# Patient Record
Sex: Male | Born: 1950 | Race: White | Hispanic: No | Marital: Married | Smoking: Never smoker
Health system: Southern US, Community
[De-identification: ages and names within clinical notes are randomized; demographics above are authoritative.]

## PROBLEM LIST (undated history)

## (undated) DIAGNOSIS — Z860100 Personal history of colon polyps, unspecified: Secondary | ICD-10-CM

## (undated) DIAGNOSIS — I10 Essential (primary) hypertension: Secondary | ICD-10-CM

## (undated) HISTORY — PX: CARPAL TUNNEL RELEASE: SHX101

## (undated) HISTORY — DX: Personal history of colonic polyps: Z86.010

## (undated) HISTORY — DX: Essential (primary) hypertension: I10

## (undated) HISTORY — DX: Personal history of colon polyps, unspecified: Z86.0100

---

## 2004-12-28 ENCOUNTER — Emergency Department (HOSPITAL_COMMUNITY): Admission: EM | Admit: 2004-12-28 | Discharge: 2004-12-28 | Payer: Self-pay | Admitting: Emergency Medicine

## 2005-05-19 ENCOUNTER — Emergency Department (HOSPITAL_COMMUNITY): Admission: EM | Admit: 2005-05-19 | Discharge: 2005-05-19 | Payer: Self-pay | Admitting: Emergency Medicine

## 2005-06-06 ENCOUNTER — Ambulatory Visit: Payer: Self-pay | Admitting: Family Medicine

## 2005-07-03 ENCOUNTER — Ambulatory Visit (HOSPITAL_COMMUNITY): Admission: RE | Admit: 2005-07-03 | Discharge: 2005-07-03 | Payer: Self-pay | Admitting: Surgery

## 2005-08-23 ENCOUNTER — Ambulatory Visit (HOSPITAL_BASED_OUTPATIENT_CLINIC_OR_DEPARTMENT_OTHER): Admission: RE | Admit: 2005-08-23 | Discharge: 2005-08-23 | Payer: Self-pay | Admitting: Orthopedic Surgery

## 2005-10-05 ENCOUNTER — Ambulatory Visit (HOSPITAL_BASED_OUTPATIENT_CLINIC_OR_DEPARTMENT_OTHER): Admission: RE | Admit: 2005-10-05 | Discharge: 2005-10-05 | Payer: Self-pay | Admitting: Orthopedic Surgery

## 2005-10-05 ENCOUNTER — Encounter (INDEPENDENT_AMBULATORY_CARE_PROVIDER_SITE_OTHER): Payer: Self-pay | Admitting: *Deleted

## 2006-02-01 ENCOUNTER — Ambulatory Visit: Payer: Self-pay | Admitting: Family Medicine

## 2006-02-01 ENCOUNTER — Encounter (INDEPENDENT_AMBULATORY_CARE_PROVIDER_SITE_OTHER): Payer: Self-pay | Admitting: Internal Medicine

## 2006-02-05 IMAGING — CR DG CHEST 2V
2 series · 2 of 2 positions shown · non-contrast
Comparison: none

CLINICAL DATA: Chest pain and pressure.
 PA AND LATERAL CHEST, 12/28/04:
 Heart size and vascularity are normal and the lungs are clear.   No significant bony abnormality.  The patient has a congenital anomaly of a bifid configuration of the anterior aspect of the right 6th rib.

[w chest pa]
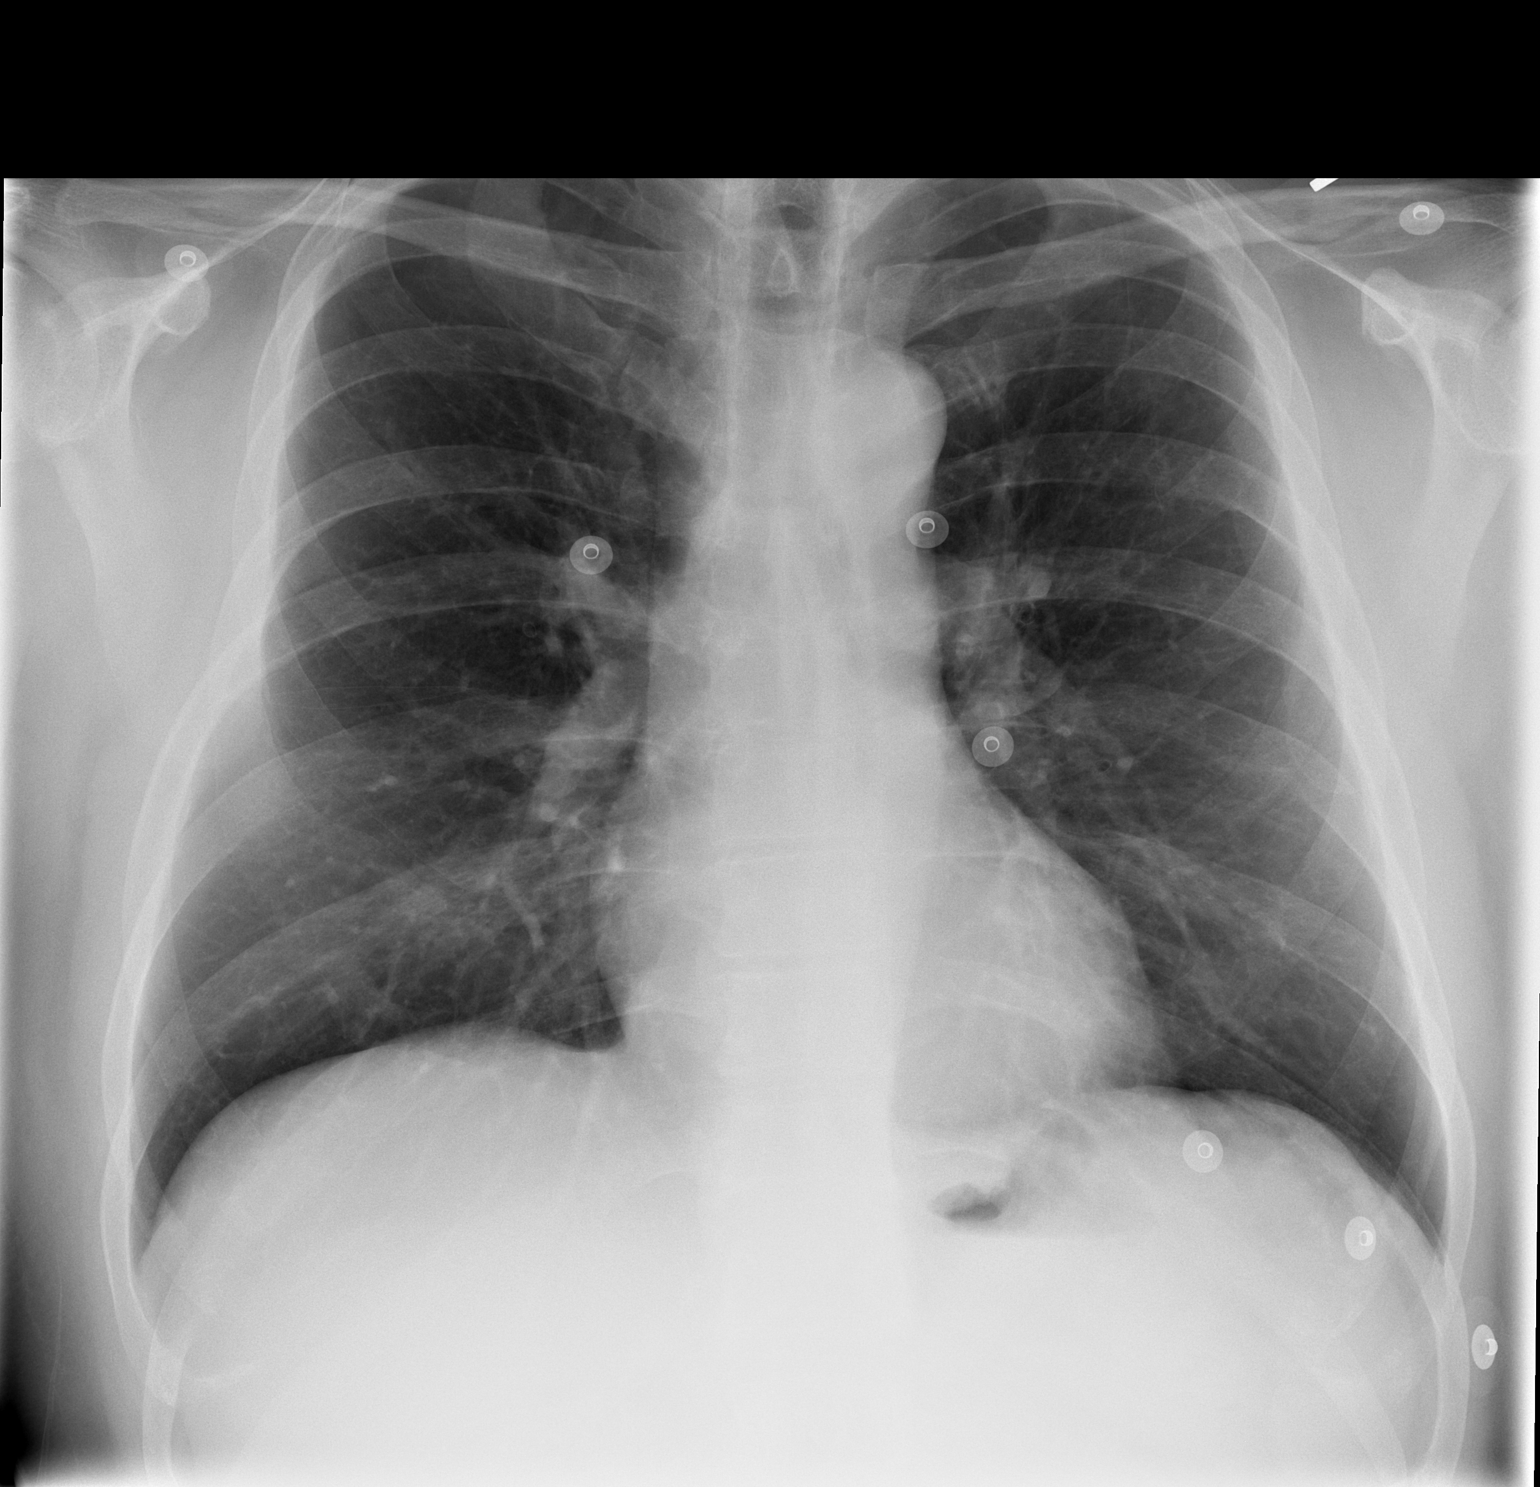

[w chest lat]
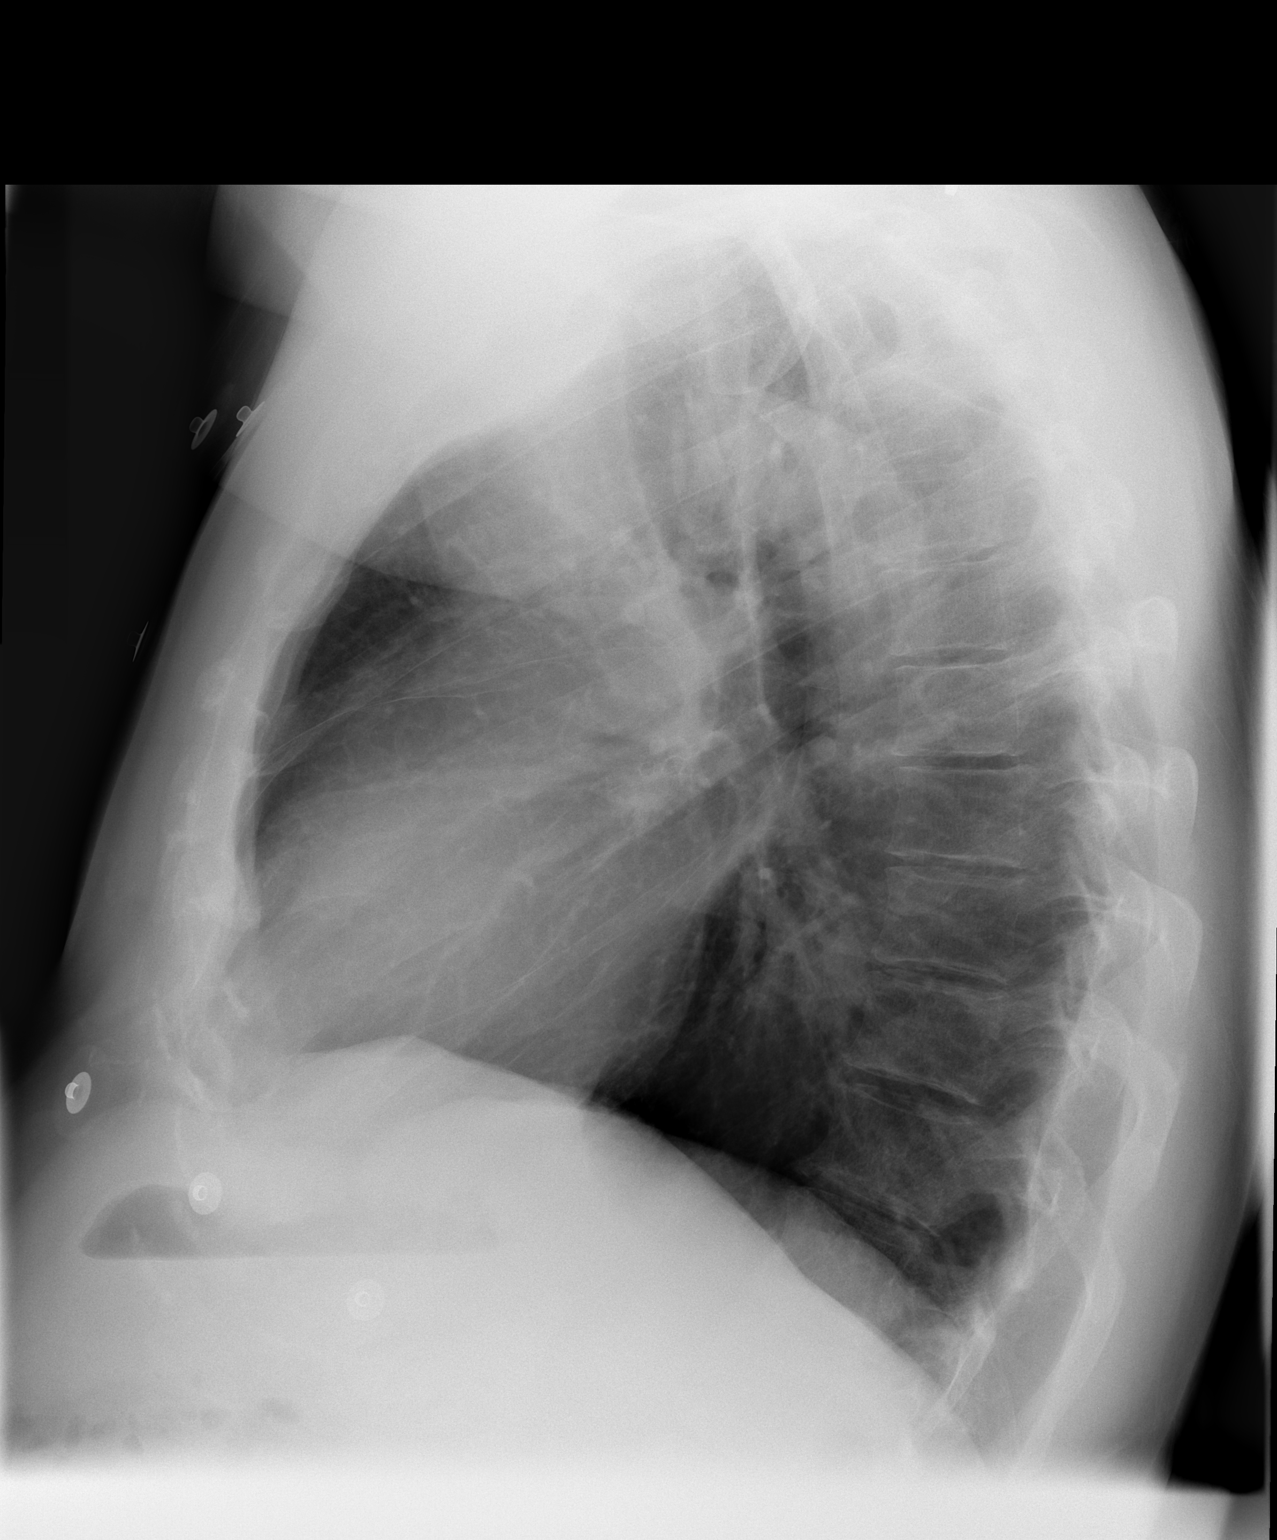

[2 of 2 positions shown; findings below may reference images not displayed]

IMPRESSION: No acute disease.

## 2006-02-26 ENCOUNTER — Ambulatory Visit: Payer: Self-pay | Admitting: Internal Medicine

## 2006-05-19 ENCOUNTER — Ambulatory Visit: Payer: Self-pay | Admitting: Family Medicine

## 2007-11-01 ENCOUNTER — Ambulatory Visit: Payer: Self-pay | Admitting: Family Medicine

## 2007-11-01 DIAGNOSIS — I10 Essential (primary) hypertension: Secondary | ICD-10-CM | POA: Insufficient documentation

## 2007-12-05 ENCOUNTER — Ambulatory Visit: Payer: Self-pay | Admitting: Internal Medicine

## 2007-12-06 LAB — CONVERTED CEMR LAB
ALT: 15 units/L (ref 0–53)
AST: 19 units/L (ref 0–37)
Albumin: 3.7 g/dL (ref 3.5–5.2)
Alkaline Phosphatase: 83 units/L (ref 39–117)
BUN: 16 mg/dL (ref 6–23)
Basophils Absolute: 0 10*3/uL (ref 0.0–0.1)
Basophils Relative: 0.3 % (ref 0.0–1.0)
Bilirubin, Direct: 0.1 mg/dL (ref 0.0–0.3)
CO2: 31 meq/L (ref 19–32)
Calcium: 8.9 mg/dL (ref 8.4–10.5)
Chloride: 103 meq/L (ref 96–112)
Cholesterol: 158 mg/dL (ref 0–200)
Creatinine, Ser: 1 mg/dL (ref 0.4–1.5)
Eosinophils Absolute: 0.2 10*3/uL (ref 0.0–0.7)
Eosinophils Relative: 2.4 % (ref 0.0–5.0)
GFR calc Af Amer: 99 mL/min
GFR calc non Af Amer: 82 mL/min
Glucose, Bld: 87 mg/dL (ref 70–99)
HCT: 43.8 % (ref 39.0–52.0)
HDL: 34.4 mg/dL — ABNORMAL LOW (ref >39.0)
Hemoglobin: 14.5 g/dL (ref 13.0–17.0)
LDL Cholesterol: 102 mg/dL — ABNORMAL HIGH (ref 0–99)
Lymphocytes Relative: 23.6 % (ref 12.0–46.0)
MCHC: 33.2 g/dL (ref 30.0–36.0)
MCV: 82.4 fL (ref 78.0–100.0)
Monocytes Absolute: 0.6 10*3/uL (ref 0.1–1.0)
Monocytes Relative: 7.5 % (ref 3.0–12.0)
Neutro Abs: 5.2 10*3/uL (ref 1.4–7.7)
Neutrophils Relative %: 66.2 % (ref 43.0–77.0)
PSA: 0.74 ng/mL (ref 0.10–4.00)
Phosphorus: 3.3 mg/dL (ref 2.3–4.6)
Platelets: 231 10*3/uL (ref 150–400)
Potassium: 3.7 meq/L (ref 3.5–5.1)
RBC: 5.32 M/uL (ref 4.22–5.81)
RDW: 12.3 % (ref 11.5–14.6)
Sodium: 139 meq/L (ref 135–145)
TSH: 1.87 microintl units/mL (ref 0.35–5.50)
Total Bilirubin: 0.8 mg/dL (ref 0.3–1.2)
Total CHOL/HDL Ratio: 4.6
Total Protein: 6.7 g/dL (ref 6.0–8.3)
Triglycerides: 110 mg/dL (ref 0–149)
VLDL: 22 mg/dL (ref 0–40)
WBC: 7.8 10*3/uL (ref 4.5–10.5)

## 2008-01-02 ENCOUNTER — Telehealth (INDEPENDENT_AMBULATORY_CARE_PROVIDER_SITE_OTHER): Payer: Self-pay | Admitting: *Deleted

## 2008-02-21 ENCOUNTER — Encounter: Payer: Self-pay | Admitting: Internal Medicine

## 2008-02-21 LAB — HM COLONOSCOPY: HM Colonoscopy: NORMAL

## 2008-03-11 ENCOUNTER — Ambulatory Visit: Payer: Self-pay | Admitting: Internal Medicine

## 2008-05-08 ENCOUNTER — Encounter: Payer: Self-pay | Admitting: Internal Medicine

## 2008-09-14 ENCOUNTER — Ambulatory Visit: Payer: Self-pay | Admitting: Internal Medicine

## 2008-12-24 ENCOUNTER — Encounter: Payer: Self-pay | Admitting: Internal Medicine

## 2009-03-18 ENCOUNTER — Encounter: Payer: Self-pay | Admitting: Internal Medicine

## 2009-03-19 ENCOUNTER — Ambulatory Visit: Payer: Self-pay | Admitting: Internal Medicine

## 2009-03-19 DIAGNOSIS — R002 Palpitations: Secondary | ICD-10-CM | POA: Insufficient documentation

## 2009-03-19 LAB — CONVERTED CEMR LAB
ALT: 10 units/L (ref 0–53)
AST: 17 units/L (ref 0–37)
Albumin: 3.9 g/dL (ref 3.5–5.2)
Alkaline Phosphatase: 67 units/L (ref 39–117)
BUN: 16 mg/dL (ref 6–23)
Basophils Absolute: 0 10*3/uL (ref 0.0–0.1)
Basophils Relative: 0 % (ref 0.0–3.0)
Bilirubin, Direct: 0.2 mg/dL (ref 0.0–0.3)
CO2: 30 meq/L (ref 19–32)
Calcium: 9.3 mg/dL (ref 8.4–10.5)
Chloride: 106 meq/L (ref 96–112)
Cholesterol: 173 mg/dL (ref 0–200)
Creatinine, Ser: 0.9 mg/dL (ref 0.4–1.5)
Eosinophils Absolute: 0.3 10*3/uL (ref 0.0–0.7)
Eosinophils Relative: 4.3 % (ref 0.0–5.0)
Glucose, Bld: 84 mg/dL (ref 70–99)
HCT: 42.9 % (ref 39.0–52.0)
HDL: 57.4 mg/dL (ref >39.00)
Hemoglobin: 14.1 g/dL (ref 13.0–17.0)
LDL Cholesterol: 106 mg/dL — ABNORMAL HIGH (ref 0–99)
Lymphocytes Relative: 27 % (ref 12.0–46.0)
Lymphs Abs: 1.9 10*3/uL (ref 0.7–4.0)
MCHC: 32.8 g/dL (ref 30.0–36.0)
MCV: 83.2 fL (ref 78.0–100.0)
Monocytes Absolute: 0.6 10*3/uL (ref 0.1–1.0)
Monocytes Relative: 7.9 % (ref 3.0–12.0)
Neutro Abs: 4.2 10*3/uL (ref 1.4–7.7)
Neutrophils Relative %: 60.8 % (ref 43.0–77.0)
PSA: 0.91 ng/mL (ref 0.10–4.00)
Phosphorus: 3.7 mg/dL (ref 2.3–4.6)
Platelets: 201 10*3/uL (ref 150.0–400.0)
Potassium: 3.9 meq/L (ref 3.5–5.1)
RBC: 5.16 M/uL (ref 4.22–5.81)
RDW: 13.1 % (ref 11.5–14.6)
Sodium: 142 meq/L (ref 135–145)
TSH: 2.34 microintl units/mL (ref 0.35–5.50)
Total Bilirubin: 1.1 mg/dL (ref 0.3–1.2)
Total CHOL/HDL Ratio: 3
Total Protein: 6.4 g/dL (ref 6.0–8.3)
Triglycerides: 49 mg/dL (ref 0.0–149.0)
VLDL: 9.8 mg/dL (ref 0.0–40.0)
WBC: 7 10*3/uL (ref 4.5–10.5)

## 2010-02-18 ENCOUNTER — Ambulatory Visit: Payer: Self-pay | Admitting: Family Medicine

## 2010-08-05 ENCOUNTER — Telehealth: Payer: Self-pay | Admitting: Internal Medicine

## 2010-08-15 ENCOUNTER — Ambulatory Visit: Admit: 2010-08-15 | Payer: Self-pay | Admitting: Internal Medicine

## 2010-08-31 ENCOUNTER — Ambulatory Visit
Admission: RE | Admit: 2010-08-31 | Discharge: 2010-08-31 | Payer: Self-pay | Source: Home / Self Care | Attending: Internal Medicine | Admitting: Internal Medicine

## 2010-09-07 NOTE — Assessment & Plan Note (Signed)
Summary: Kurt Mccarthy bp check/rbh  Nurse Visit   Vital Signs:  Patient profile:   60 year old male Weight:      191 pounds Pulse rate:   76 / minute Pulse rhythm:   regular BP sitting:   138 / 94  (left arm) Cuff size:   regular  Vitals Entered By: Lowella Petties CMA (February 18, 2010 4:34 PM) CC: Pt walked in , requested BP check.  Had veritigo this morning, checked his blood pressure and it was 180/110.  Feels fine now, no other problems.  Taking lisinopril.  I advised him to schedule appt with Dr. Alphonsus Sias when able, go to urgent care this week end if further problems.   Allergies: 1)  ! Pcn  Appended Document: Kurt Mccarthy bp check/rbh Per report, patient w/o CP/SOB and vitals stable.  OV in near future would be reasonable.  No need for emergent eval today esp since symptoms have resolved.

## 2010-09-08 NOTE — Assessment & Plan Note (Signed)
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 Pcn  Past History:  Past medical, surgical, family and social histories (including risk factors) reviewed for relevance to current acute and chronic problems.  Past Medical History: Reviewed history from 12/05/2007 and no changes required. Hypertension  Past Surgical History: Reviewed history from 12/05/2007 and no changes required. 3/07  Rgiht carpal tunnel release 4/07  Left carpal tunnel release  Family History: Reviewed history from 03/11/2008 and no changes required. Father: died at 75--lung ca/smoker,  Mother: died at 68- MI--first MI at age 36, DM, HBP Siblings: 2 brothers                4 sisters --1 with DM, HBP, obese  CAD in Mat GF DM in Pat GF CVA's on mom's side No prostate or colon cancer  Social History: Reviewed history from 09/14/2008 and no changes required. Marital Status: Married Children: 1 son, 2 grandsons Occupation: [employer] fast [employer] Never Smoked Alcohol use-rare  Review of Systems       weight is up 4# since the summer stays off sugared drinks sleeps okay---nocturia x 1-2  Physical Exam  General:  alert and normal appearance.   Neck:  supple, no masses, no thyromegaly, no carotid bruits, and no cervical lymphadenopathy.   Lungs:  normal respiratory effort, no intercostal retractions, no  accessory muscle use, and normal breath sounds.   Heart:  normal rate, regular rhythm, no murmur, and no gallop.   Extremities:  no edema Psych:  normally interactive, good eye contact, not anxious appearing, and not depressed appearing.     Impression & Recommendations:  Problem # 1:  HYPERTENSION (ICD-401.9) Assessment Deteriorated still not at goal will increase to 20mg  ---add diuretic if that isn't effective  The following medications were removed from the medication list:    Lisinopril 10 Mg Tabs (Lisinopril) .Marland Kitchen... 1 once daily for bp by mouth His updated medication list for this problem includes:    Lisinopril 20 Mg Tabs (Lisinopril) .Marland Kitchen... 1 tab daily for high blood pressure  BP today: 134/95 Prior BP: 138/94 (02/18/2010)  Labs Reviewed: K+: 3.9 (03/19/2009) Creat: : 0.9 (03/19/2009)   Chol: 173 (03/19/2009)   HDL: 57.40 (03/19/2009)   LDL: 106 (03/19/2009)   TG: 49.0 (03/19/2009)  Complete Medication List: 1)  Lisinopril 20 Mg Tabs (Lisinopril) .Marland Kitchen.. 1 tab daily for high blood pressure  Patient Instructions: 1)  Please schedule a follow-up appointment in 6 months for physical 2)  Please schedule blood work in about 1 month (renal--401.9) Prescriptions: LISINOPRIL 20 MG TABS (LISINOPRIL) 1 tab daily for high blood pressure  #90 x 3   Entered and Authorized by:   Cindee Salt MD   Signed by:  Cindee Salt MD on 08/31/2010   Method used:   Electronically to        [facility] [address] (retail)       [address], [address]       [place], [place]  [address]       Ph: [phone]       Fax: [phone]   RxID:   [id number]    Orders Added: 1)  Est. Patient Level III [70623]    Current Allergies (reviewed today): ! PCN

## 2010-09-08 NOTE — Progress Notes (Signed)
Summary: Need appt  Phone Note Outgoing Call   Call placed by: Mervin Hack CMA Duncan Dull),  August 05, 2010 2:28 PM Call placed to: Patient Summary of Call: calling pt to advise he needs to make appt in order to keep getting refills, request for Lisinopril Initial call taken by: Mervin Hack CMA Duncan Dull),  August 05, 2010 2:30 PM  Follow-up for Phone Call        Spoke with patient and advised results, appt made 08/15/2009 @ 4:15, refill sent to pharmacy DeShannon Katrinka Blazing CMA Duncan Dull)  August 05, 2010 2:31 PM     Prescriptions: LISINOPRIL 10 MG  TABS (LISINOPRIL) 1 once daily for BP by mouth  #30 x 0   Entered by:   Mervin Hack CMA (AAMA)   Authorized by:   Cindee Salt MD   Signed by:   Mervin Hack CMA (AAMA) on 08/05/2010   Method used:   Electronically to        [facility] [address] (retail)       [address], [address]       [place], [place]  [address]       Ph: [phone]       Fax: [phone]   RxID:   [id number]

## 2010-09-30 ENCOUNTER — Other Ambulatory Visit: Payer: Self-pay | Admitting: Internal Medicine

## 2010-09-30 ENCOUNTER — Encounter (INDEPENDENT_AMBULATORY_CARE_PROVIDER_SITE_OTHER): Payer: Self-pay | Admitting: *Deleted

## 2010-09-30 ENCOUNTER — Other Ambulatory Visit (INDEPENDENT_AMBULATORY_CARE_PROVIDER_SITE_OTHER): Payer: 59

## 2010-09-30 DIAGNOSIS — I10 Essential (primary) hypertension: Secondary | ICD-10-CM

## 2010-09-30 LAB — RENAL FUNCTION PANEL
Albumin: 4 g/dL (ref 3.5–5.2)
BUN: 25 mg/dL — ABNORMAL HIGH (ref 6–23)
CO2: 29 mEq/L (ref 19–32)
Calcium: 9.3 mg/dL (ref 8.4–10.5)
Chloride: 105 mEq/L (ref 96–112)
Creatinine, Ser: 0.9 mg/dL (ref 0.4–1.5)
GFR: 95.02 mL/min (ref >60.00)
Glucose, Bld: 91 mg/dL (ref 70–99)
Phosphorus: 3.4 mg/dL (ref 2.3–4.6)
Potassium: 4.2 mEq/L (ref 3.5–5.1)
Sodium: 140 mEq/L (ref 135–145)

## 2010-12-23 NOTE — Op Note (Signed)
NAME:  LEONARDO, MAKRIS              ACCOUNT NO.:  [id number]   MEDICAL RECORD NO.:  [id number]          PATIENT TYPE:  AMB   LOCATION:  DSC                          FACILITY:  [facility]   PHYSICIAN:  Thornton Park. Daphine Deutscher, MD  DATE OF BIRTH:  07/13/1951   DATE OF PROCEDURE:  10/05/2005  DATE OF DISCHARGE:                                 OPERATIVE REPORT   PROCEDURE:  Excision of mass of the right lower anterior abdomen.   PREOP DIAGNOSIS:  Soft mass longstanding, right lower quadrant, lipoma  versus cyst.   POSTOP DIAGNOSIS:  A 4 cm sebaceous cyst.   SURGEON:  Daphine Deutscher.   ANESTHESIA:  General by LMA   DESCRIPTION OF PROCEDURE:  Kalib Bhagat is a 60 year old male undergoing  carpal tunnel and excision of cyst the same day. He was given an LMA  general. The lower abdomen was marked, prepped with Hibiclens and draped  sterilely. I made a transverse incision and gently tried to get beneath the  skin and entered a liquefied foul-smelling sebaceous cyst. I then began  creating some very superficial flaps and was able to get around this and  excise the sac wall in toto, although I had previously injected this at the  site at the time of the procedure and did not demonstrate a punctum. I went  ahead and excised the sac in toto and then went wide on either side and  excised more skin, had fresh edges and then irrigated this with a large  volume of saline. There is no cyst remnant present and there was a clean  wound, although there had been some soilage from the material that leaked  out of the cyst. This was not grossly infected but just was a chronic  sebaceous cyst. The wound was closed interrupted 4-0 Vicryl subcutaneously  and with Benzoin Steri-Strips. The patient will come back to the office to  see me in about three to four weeks.   FINAL DIAGNOSIS:  Excision of sebaceous cyst, right lower quadrant.      Thornton Park Daphine Deutscher, MD  Electronically Signed     MBM/MEDQ  D:  10/05/2005   T:  10/05/2005  Job:  [id number]

## 2010-12-23 NOTE — Op Note (Signed)
NAME:  Kurt Mccarthy, Kurt Mccarthy              ACCOUNT NO.:  [id number]   MEDICAL RECORD NO.:  [id number]          PATIENT TYPE:  AMB   LOCATION:  DSC                          FACILITY:  [facility]   PHYSICIAN:  Cindee Salt, M.D.       DATE OF BIRTH:  08-31-1950   DATE OF PROCEDURE:  08/23/2005  DATE OF DISCHARGE:                                 OPERATIVE REPORT   PREOPERATIVE DIAGNOSIS:  Carpal tunnel syndrome. right hand.   POSTOPERATIVE DIAGNOSIS:  Carpal tunnel syndrome. right hand.   OPERATION:  Decompression right median nerve.   SURGEON:  Kuzma   ASSISTANT:  Joaquin Courts R.N.   ANESTHESIA:  Forearm based IV regional.   HISTORY:  The patient is a 60 year old male with history of carpal tunnel  syndrome, EMG nerve conductions positive.  This has not responded to  conservative treatment.  The risks and complications of surgery are  discussed with him. The questions were answered prior to surgery. He has  elected to proceed to have this done.  The arm was marked by both the  patient and surgeon in the preoperative area.   PROCEDURE:  The patient was brought to the operating room where a forearm  based IV regional anesthetic was carried out without difficulty.  He was  prepped using DuraPrep, supine position, right arm free. A longitudinal  incision was made in the palm and carried down through subcutaneous tissue.  Bleeders were electrocauterized, the palmar fascia was split, superficial  palmar arch identified.  The flexor tendon to the ring and little finger  identified to the ulnar side of the median nerve.  The carpal retinaculum  was incised with sharp dissection. A right angle and Sewall retractor were  placed between skin and forearm fascia. Fascia was released for  approximately 1.5 cm proximal to the wrist crease under direct vision. Canal  was explored.  Tenosynovial tissue was moderately thickened.  Area of  deformity was present to the nerve. No further lesions were identified.  The  wound was irrigated. The skin was closed with interrupted 5-0 nylon sutures.  A sterile compressive dressing and splint was applied. The patient tolerated  the procedure well was taken to the recovery room for observation in  satisfactory condition. He is discharged home to return to the [facility]  [facility] [place] in 1 week on Vicodin.           ______________________________  Cindee Salt, M.D.     GK/MEDQ  D:  08/23/2005  T:  08/23/2005  Job:  [id number]

## 2010-12-23 NOTE — Op Note (Signed)
NAME:  Kurt Mccarthy, Kurt Mccarthy              ACCOUNT NO.:  [id number]   MEDICAL RECORD NO.:  [id number]          PATIENT TYPE:  AMB   LOCATION:  DSC                          FACILITY:  [facility]   PHYSICIAN:  Cindee Salt, M.D.       DATE OF BIRTH:  11-Jul-1951   DATE OF PROCEDURE:  10/05/2005  DATE OF DISCHARGE:                                 OPERATIVE REPORT   PREOPERATIVE DIAGNOSIS:  Carpal tunnel syndrome, left hand.   POSTOPERATIVE DIAGNOSIS:  Carpal tunnel syndrome, left hand.   OPERATION:  Decompression of left median nerve.   SURGEON:  Cindee Salt, M.D.   ANESTHESIA:  General.   COMMENT:  This was done in conjunction with Dr. Daphine Deutscher removing a mass from  his abdomen, which he will dictate separately.   HISTORY:  The patient is a 60 year old male with a history of bilateral  carpal tunnel syndrome, EMG and nerve conductions positive, which has not  responded to conservative treatment.  He has undergone release on his right  side.  He is admitted now for release of left carpal canal median nerve.   PROCEDURE:  The patient's extremity was marked in the preoperative area.  He  was taken to the operating room, where a general anesthetic was carried out  without difficulty.  Dr. Daphine Deutscher did his portion first.  The patient was in a  supine position, prepped with the left arm free; this was done with  DuraPrep.  A tourniquet placed high on the arm was inflated to 250 mmHg  after draping and exsanguination of the limb with an Esmarch bandage.  A  longitudinal incision was made in the palm and carried down through  subcutaneous tissue.  Bleeders were electrocauterized.  The palmar fascia  was split and  superficial palmar arch identified, flexor tendons to the  ring and little finger identified.  To the ulnar side of the median nerve,  the carpal retinaculum was incised with sharp dissection.  A right-angle and  Sewall retractor were placed between skin and forearm fascia.  The fascia  was  released for approximately a centimeter and a half proximal to the wrist  crease under direct vision.  Canal was explored; no further lesions were  identified.  The area of deformity to the nerve was immediately apparent.  No further lesions were identified.  The wound was irrigated.  The skin was  closed with interrupted 5-0 nylon sutures.  A sterile compressive dressing  and splint were applied.  The patient tolerated the procedure well and was  taken to the recovery room for observation in satisfactory condition.   He is discharged home to return to the [facility] [place] in 1 week  on Vicodin.  He will return to see Dr. Daphine Deutscher per his orders.           ______________________________  Cindee Salt, M.D.     GK/MEDQ  D:  10/05/2005  T:  10/05/2005  Job:  [id number]   cc:   Thornton Park Daphine Deutscher, MD  [address], [address]  [place]  [place] [address]

## 2011-02-27 ENCOUNTER — Encounter: Payer: Self-pay | Admitting: Internal Medicine

## 2011-02-28 ENCOUNTER — Encounter: Payer: Self-pay | Admitting: Internal Medicine

## 2011-02-28 ENCOUNTER — Ambulatory Visit (INDEPENDENT_AMBULATORY_CARE_PROVIDER_SITE_OTHER): Payer: 59 | Admitting: Internal Medicine

## 2011-02-28 DIAGNOSIS — I10 Essential (primary) hypertension: Secondary | ICD-10-CM

## 2011-02-28 DIAGNOSIS — Z2911 Encounter for prophylactic immunotherapy for respiratory syncytial virus (RSV): Secondary | ICD-10-CM

## 2011-02-28 DIAGNOSIS — Z0000 Encounter for general adult medical examination without abnormal findings: Secondary | ICD-10-CM | POA: Insufficient documentation

## 2011-02-28 LAB — HEPATIC FUNCTION PANEL
ALT: 14 U/L (ref 0–53)
AST: 18 U/L (ref 0–37)
Albumin: 4.1 g/dL (ref 3.5–5.2)
Alkaline Phosphatase: 67 U/L (ref 39–117)
Bilirubin, Direct: 0.2 mg/dL (ref 0.0–0.3)
Total Bilirubin: 1 mg/dL (ref 0.3–1.2)
Total Protein: 6.7 g/dL (ref 6.0–8.3)

## 2011-02-28 LAB — BASIC METABOLIC PANEL
BUN: 18 mg/dL (ref 6–23)
CO2: 30 mEq/L (ref 19–32)
Calcium: 9.1 mg/dL (ref 8.4–10.5)
Chloride: 106 mEq/L (ref 96–112)
Creatinine, Ser: 0.9 mg/dL (ref 0.4–1.5)
GFR: 88.96 mL/min (ref >60.00)
Glucose, Bld: 86 mg/dL (ref 70–99)
Potassium: 4 mEq/L (ref 3.5–5.1)
Sodium: 140 mEq/L (ref 135–145)

## 2011-02-28 LAB — CBC WITH DIFFERENTIAL/PLATELET
Basophils Absolute: 0 10*3/uL (ref 0.0–0.1)
Basophils Relative: 0.7 % (ref 0.0–3.0)
Eosinophils Absolute: 0.3 10*3/uL (ref 0.0–0.7)
Eosinophils Relative: 4.3 % (ref 0.0–5.0)
HCT: 40.5 % (ref 39.0–52.0)
Hemoglobin: 13.4 g/dL (ref 13.0–17.0)
Lymphocytes Relative: 30.2 % (ref 12.0–46.0)
Lymphs Abs: 2 10*3/uL (ref 0.7–4.0)
MCHC: 33.2 g/dL (ref 30.0–36.0)
MCV: 82.2 fl (ref 78.0–100.0)
Monocytes Absolute: 0.6 10*3/uL (ref 0.1–1.0)
Monocytes Relative: 8.9 % (ref 3.0–12.0)
Neutro Abs: 3.7 10*3/uL (ref 1.4–7.7)
Neutrophils Relative %: 55.9 % (ref 43.0–77.0)
Platelets: 184 10*3/uL (ref 150.0–400.0)
RBC: 4.93 Mil/uL (ref 4.22–5.81)
RDW: 13.6 % (ref 11.5–14.6)
WBC: 6.6 10*3/uL (ref 4.5–10.5)

## 2011-02-28 LAB — PSA: PSA: 2.38 ng/mL (ref 0.10–4.00)

## 2011-02-28 LAB — TSH: TSH: 2.39 u[IU]/mL (ref 0.35–5.50)

## 2011-02-28 NOTE — Progress Notes (Signed)
Subjective:    Patient ID: Kurt Mccarthy, male    DOB: [birth year], 60 y.o.   MRN: [id number]  HPI DOing well Work is okay though not as busy as he would like No new concerns  Checks BP Running high lately--- 138/95  Current Outpatient Prescriptions on File Prior to Visit  Medication Sig Dispense Refill  . lisinopril (PRINIVIL,ZESTRIL) 20 MG tablet Take 20 mg by mouth daily.          Allergies  Allergen Reactions  . Penicillins     Past Medical History  Diagnosis Date  . Hypertension     Past Surgical History  Procedure Date  . Carpal tunnel release 03/07 & 04/07    right and left    Family History  Problem Relation Age of Onset  . Heart disease Maternal Grandfather   . Diabetes Paternal Grandfather   . Cancer Neg Hx     History   Social History  . Marital Status: Married    Spouse Name: N/A    Number of Children: 1  . Years of Education: N/A   Occupational History  . [employer]    Social History Main Topics  . Smoking status: Never Smoker   . Smokeless tobacco: Never Used  . Alcohol Use: Yes  . Drug Use: No  . Sexually Active: Not on file   Other Topics Concern  . Not on file   Social History Narrative  . No narrative on file   Review of Systems  Constitutional: Negative for fatigue and unexpected weight change.       Wears seat belt Exercises some ---yard work and stays active Happy with current weight  HENT: Positive for congestion and rhinorrhea. Negative for hearing loss, dental problem and tinnitus.        Occ takes allergy pill from OTC Keeps up with dentist  Eyes: Negative for visual disturbance.       No diplopia or focal vision loss  Respiratory: Negative for cough, chest tightness and shortness of breath.   Cardiovascular: Negative for chest pain, palpitations and leg swelling.  Gastrointestinal: Negative for nausea, vomiting, constipation and blood in stool.       No sig heartburn  Genitourinary: Positive for frequency.  Negative for dysuria and difficulty urinating.       No sexual problems Nocturia x 2 which is stable  Musculoskeletal: Positive for myalgias. Negative for back pain, joint swelling and arthralgias.       Occ leg aching   Skin: Negative for rash.       Sees derm regularly Has actinics on head treated   Neurological: Positive for headaches. Negative for dizziness, syncope, weakness and light-headedness.       Occ sinus headaches  Psychiatric/Behavioral: Negative for sleep disturbance and dysphoric mood. The patient is not nervous/anxious.        Objective:   Physical Exam  Constitutional: He is oriented to person, place, and time. He appears well-developed and well-nourished. No distress.  HENT:  Head: Normocephalic and atraumatic.  Right Ear: External ear normal.  Left Ear: External ear normal.  Mouth/Throat: Oropharynx is clear and moist. No oropharyngeal exudate.       TMs normal  Eyes: Conjunctivae and EOM are normal. Pupils are equal, round, and reactive to light.       Fundi normal  Neck: Normal range of motion. Neck supple. No thyromegaly present.  Cardiovascular: Normal rate, regular rhythm, normal heart sounds and intact distal pulses.  Exam  reveals no gallop.   No murmur heard. Pulmonary/Chest: Effort normal and breath sounds normal. No respiratory distress. He has no wheezes. He has no rales.  Abdominal: Soft. He exhibits no mass. There is no tenderness.  Musculoskeletal: Normal range of motion. He exhibits no edema and no tenderness.  Lymphadenopathy:    He has no cervical adenopathy.  Neurological: He is alert and oriented to person, place, and time. He exhibits normal muscle tone.       Normal strength and gait   Skin: Skin is warm. No rash noted.  Psychiatric: He has a normal mood and affect. His behavior is normal. Judgment and thought content normal.          Assessment & Plan:

## 2011-02-28 NOTE — Assessment & Plan Note (Signed)
BP Readings from Last 3 Encounters:  02/28/11 120/80  08/31/10 134/95  02/18/10 138/94   Good control Will check labs No changes needed

## 2011-02-28 NOTE — Assessment & Plan Note (Signed)
Doing well Discussed more regular set exercise---like walking Will check PSA after discussion

## 2011-12-11 ENCOUNTER — Other Ambulatory Visit: Payer: Self-pay | Admitting: Dermatology

## 2012-10-02 ENCOUNTER — Other Ambulatory Visit: Payer: Self-pay | Admitting: Dermatology

## 2012-10-22 ENCOUNTER — Ambulatory Visit (INDEPENDENT_AMBULATORY_CARE_PROVIDER_SITE_OTHER): Payer: BC Managed Care – PPO | Admitting: Internal Medicine

## 2012-10-22 ENCOUNTER — Encounter: Payer: Self-pay | Admitting: Internal Medicine

## 2012-10-22 ENCOUNTER — Ambulatory Visit: Payer: 59 | Admitting: Internal Medicine

## 2012-10-22 VITALS — BP 142/98 | HR 74 | Temp 98.5°F | Wt 199.0 lb

## 2012-10-22 DIAGNOSIS — L02519 Cutaneous abscess of unspecified hand: Secondary | ICD-10-CM

## 2012-10-22 DIAGNOSIS — L03012 Cellulitis of left finger: Secondary | ICD-10-CM | POA: Insufficient documentation

## 2012-10-22 MED ORDER — LISINOPRIL-HYDROCHLOROTHIAZIDE 20-12.5 MG PO TABS: 1.0000 | ORAL_TABLET | Freq: Every day | ORAL | Status: DC

## 2012-10-22 MED ORDER — LEVOFLOXACIN 500 MG PO TABS: 500.0000 mg | ORAL_TABLET | Freq: Every day | ORAL | Status: DC

## 2012-10-22 NOTE — Patient Instructions (Signed)
Try warm soaks of the the thumb 2-3 times per day to see if that helps

## 2012-10-22 NOTE — Progress Notes (Signed)
  Subjective:    Patient ID: Kurt Mccarthy, male    DOB: 1951-04-27, 62 y.o.   MRN: [id number]  HPI Had seen the NP in [place] to get refill on BP med  Splinter at end of left thumb the end of last week while cleaning up limbs--punctured through his glove Removed piece and applied liquid bandaid Then uses antibiotic ointment later  2 days ago--noted swelling and inflammation Redness and tenderness Seems some better today Did not try soaks  Current Outpatient Prescriptions on File Prior to Visit  Medication Sig Dispense Refill  . lisinopril (PRINIVIL,ZESTRIL) 20 MG tablet Take 20 mg by mouth daily.         No current facility-administered medications on file prior to visit.    Allergies  Allergen Reactions  . Penicillins     Past Medical History  Diagnosis Date  . Hypertension     Past Surgical History  Procedure Laterality Date  . Carpal tunnel release  03/07 & 04/07    right and left    Family History  Problem Relation Age of Onset  . Heart disease Maternal Grandfather   . Diabetes Paternal Grandfather   . Cancer Neg Hx     History   Social History  . Marital Status: Married    Spouse Name: N/A    Number of Children: 1  . Years of Education: N/A   Occupational History  . [employer]    Social History Main Topics  . Smoking status: Never Smoker   . Smokeless tobacco: Never Used  . Alcohol Use: Yes  . Drug Use: No  . Sexually Active: Not on file   Other Topics Concern  . Not on file   Social History Narrative  . No narrative on file   Review of Systems Noted lymph node on back of neck--concerned him No fever Some fatigue but this is vague. May be related to mild sleep problems Weight down some--he has been trying    Objective:   Physical Exam  Constitutional: He appears well-developed and well-nourished. No distress.  Lymphadenopathy:       Head (right side): No occipital adenopathy present.       Head (left side): No  submandibular and no occipital adenopathy present.       Left cervical: No posterior cervical adenopathy present.  Skin:  Puncture wound on flexor distal left thumb with redness around side with deep red area on extensor side at DIP          Assessment & Plan:

## 2012-10-22 NOTE — Assessment & Plan Note (Signed)
Has improved since 2 days ago but I am concerned about the puncture and the way the redness travelled around to other side Not sure if glove could be colonized with Pseudomonas or other pathogens like a shoe can be Need to be aggressive with closed space like a finger Will treat with levofloxacin Discussed soaks

## 2013-01-17 ENCOUNTER — Telehealth: Payer: Self-pay | Admitting: Internal Medicine

## 2013-01-17 ENCOUNTER — Encounter: Payer: Self-pay | Admitting: Family Medicine

## 2013-01-17 ENCOUNTER — Ambulatory Visit (INDEPENDENT_AMBULATORY_CARE_PROVIDER_SITE_OTHER): Payer: BC Managed Care – PPO | Admitting: Family Medicine

## 2013-01-17 VITALS — BP 118/80 | HR 84 | Temp 98.9°F | Wt 202.8 lb

## 2013-01-17 DIAGNOSIS — R5381 Other malaise: Secondary | ICD-10-CM

## 2013-01-17 DIAGNOSIS — R5383 Other fatigue: Secondary | ICD-10-CM | POA: Insufficient documentation

## 2013-01-17 MED ORDER — DOXYCYCLINE HYCLATE 100 MG PO CAPS: 100.0000 mg | ORAL_CAPSULE | Freq: Two times a day (BID) | ORAL | Status: DC

## 2013-01-17 MED ORDER — LISINOPRIL-HYDROCHLOROTHIAZIDE 20-12.5 MG PO TABS: 1.0000 | ORAL_TABLET | Freq: Every day | ORAL | Status: DC

## 2013-01-17 NOTE — Telephone Encounter (Signed)
Will see today.  

## 2013-01-17 NOTE — Progress Notes (Signed)
  Subjective:    Patient ID: Kurt Mccarthy, male    DOB: 05-18-1951, 62 y.o.   MRN: [id number]  HPI CC: fever  Not feeling well for the past week.  Wonders if getting sinus infection.  Tmax 100.1 a few hours ago.  Had night sweats a few nights ago.  Noticing more lethargic, mild headache and dizziness.  Some arthralgias of feet, knees.  Increased head congestion and head pressure.    No coughing, ear or tooth pain, skin infections or rashes, neck stiffness, abd pain, nausea, diarrhea, urinary changes like dysuria, urethral discharge.  No myalgias.  Has been bit by several ticks this spring.  This was several weeks ago.  Pt thinks some ticks were present for 1-2 days prior to pt noticing.  No sick contacts at home.  No smokers at home.  Past Medical History  Diagnosis Date  . Hypertension      Review of Systems Per HPI     Objective:   Physical Exam  Nursing note and vitals reviewed. Constitutional: He appears well-developed and well-nourished. No distress.  clammy  HENT:  Head: Normocephalic and atraumatic.  Right Ear: Tympanic membrane, external ear and ear canal normal.  Left Ear: Tympanic membrane, external ear and ear canal normal.  Nose: Nose normal. No mucosal edema or rhinorrhea. Right sinus exhibits no maxillary sinus tenderness and no frontal sinus tenderness. Left sinus exhibits no maxillary sinus tenderness and no frontal sinus tenderness.  Mouth/Throat: Oropharynx is clear and moist. No oropharyngeal exudate.  Eyes: Conjunctivae and EOM are normal. Pupils are equal, round, and reactive to light. No scleral icterus.  Neck: Normal range of motion. Neck supple. No thyromegaly present.  Cardiovascular: Normal rate, regular rhythm, normal heart sounds and intact distal pulses.   No murmur heard. Pulmonary/Chest: Effort normal and breath sounds normal. No respiratory distress. He has no wheezes. He has no rales.  Abdominal: Soft. Normal appearance and bowel sounds are  normal. He exhibits no distension and no mass. There is no hepatosplenomegaly. There is no tenderness. There is no rigidity, no rebound, no guarding, no CVA tenderness and negative Murphy's sign.  Musculoskeletal: He exhibits no edema.  Lymphadenopathy:    He has no cervical adenopathy.  Skin: Skin is warm. No rash noted. He is diaphoretic.       Assessment & Plan:

## 2013-01-17 NOTE — Telephone Encounter (Signed)
Patient Information:  Caller Name: Kurt Mccarthy  Phone: [phone]  Patient: Kurt, Mccarthy  Gender: Male  DOB: 1950-09-06  Age: 62 Years  PCP: Tillman Abide [facility])  Office Follow Up:  Does the office need to follow up with this patient?: No  Instructions For The Office: N/A   Symptoms  Reason For Call & Symptoms: has been feeling ill since Tuesday; waking up sweating at night; not sleeping well; temps 99-100; has been bitten by several ticks in the past few wks; feels achy; HA; slihgt sore throat and chest tightness  Reviewed Health History In EMR: Yes  Reviewed Medications In EMR: Yes  Reviewed Allergies In EMR: Yes  Reviewed Surgeries / Procedures: Yes  Date of Onset of Symptoms: 01/14/2013  Treatments Tried: Tylenol  Treatments Tried Worked: Yes  Any Fever: Yes  Fever Taken: Oral  Fever Time Of Reading: 13:10:00  Fever Last Reading: 100.4  Guideline(s) Used:  Fever  Disposition Per Guideline:   See Today in Office  Reason For Disposition Reached:   Patient wants to be seen  Advice Given:  N/A  Patient Will Follow Care Advice:  YES  Appointment Scheduled:  01/17/2013 14:45:00 Appointment Scheduled Provider:  Eustaquio Boyden (Family Practice)

## 2013-01-17 NOTE — Assessment & Plan Note (Addendum)
With arthralgias, head pressure and coryza.  Nonspecific sxs.  Anticipate viral illness. In setting of recent tick bites, provided with doxy script, discussed reasons to treat.   Pt agrees, will monitor sxs for next few days, if viral should resolve by then. Supportive care for now - tylenol ,ibuprofen, and increased fluids.

## 2013-01-17 NOTE — Patient Instructions (Addendum)
I think you have a viral illness.  Supportive care for now - use ibuprofen or tylenol for discomfort as needed. Push fluids and rest. If not improving over next 3 days, may fill antibiotic provided today. If new symptoms, let us know.

## 2013-02-21 ENCOUNTER — Encounter: Payer: BC Managed Care – PPO | Admitting: Internal Medicine

## 2013-08-29 ENCOUNTER — Encounter: Payer: Self-pay | Admitting: Internal Medicine

## 2013-08-29 ENCOUNTER — Ambulatory Visit (INDEPENDENT_AMBULATORY_CARE_PROVIDER_SITE_OTHER): Payer: BC Managed Care – PPO | Admitting: Internal Medicine

## 2013-08-29 VITALS — BP 122/80 | HR 66 | Temp 98.2°F | Ht 68.0 in | Wt 205.0 lb

## 2013-08-29 DIAGNOSIS — Z125 Encounter for screening for malignant neoplasm of prostate: Secondary | ICD-10-CM

## 2013-08-29 DIAGNOSIS — Z0000 Encounter for general adult medical examination without abnormal findings: Secondary | ICD-10-CM

## 2013-08-29 DIAGNOSIS — I10 Essential (primary) hypertension: Secondary | ICD-10-CM

## 2013-08-29 LAB — CBC WITH DIFFERENTIAL/PLATELET
Basophils Absolute: 0 10*3/uL (ref 0.0–0.1)
Basophils Relative: 0.4 % (ref 0.0–3.0)
Eosinophils Absolute: 0.2 10*3/uL (ref 0.0–0.7)
Eosinophils Relative: 2.3 % (ref 0.0–5.0)
HCT: 41.2 % (ref 39.0–52.0)
Hemoglobin: 13.6 g/dL (ref 13.0–17.0)
Lymphocytes Relative: 32.5 % (ref 12.0–46.0)
Lymphs Abs: 2.6 10*3/uL (ref 0.7–4.0)
MCHC: 33 g/dL (ref 30.0–36.0)
MCV: 80.8 fl (ref 78.0–100.0)
Monocytes Absolute: 0.6 10*3/uL (ref 0.1–1.0)
Monocytes Relative: 7.9 % (ref 3.0–12.0)
Neutro Abs: 4.5 10*3/uL (ref 1.4–7.7)
Neutrophils Relative %: 56.9 % (ref 43.0–77.0)
Platelets: 224 10*3/uL (ref 150.0–400.0)
RBC: 5.1 Mil/uL (ref 4.22–5.81)
RDW: 14.1 % (ref 11.5–14.6)
WBC: 8 10*3/uL (ref 4.5–10.5)

## 2013-08-29 LAB — COMPREHENSIVE METABOLIC PANEL
ALT: 12 U/L (ref 0–53)
AST: 15 U/L (ref 0–37)
Albumin: 3.8 g/dL (ref 3.5–5.2)
Alkaline Phosphatase: 82 U/L (ref 39–117)
BUN: 22 mg/dL (ref 6–23)
CO2: 31 mEq/L (ref 19–32)
Calcium: 9.4 mg/dL (ref 8.4–10.5)
Chloride: 100 mEq/L (ref 96–112)
Creatinine, Ser: 1 mg/dL (ref 0.4–1.5)
GFR: 84.01 mL/min (ref >60.00)
Glucose, Bld: 81 mg/dL (ref 70–99)
Potassium: 4.1 mEq/L (ref 3.5–5.1)
Sodium: 136 mEq/L (ref 135–145)
Total Bilirubin: 0.8 mg/dL (ref 0.3–1.2)
Total Protein: 6.3 g/dL (ref 6.0–8.3)

## 2013-08-29 LAB — TSH: TSH: 2 u[IU]/mL (ref 0.35–5.50)

## 2013-08-29 LAB — T4, FREE: Free T4: 0.92 ng/dL (ref 0.60–1.60)

## 2013-08-29 LAB — LIPID PANEL
Cholesterol: 154 mg/dL (ref 0–200)
HDL: 49.5 mg/dL (ref >39.00)
LDL Cholesterol: 95 mg/dL (ref 0–99)
Total CHOL/HDL Ratio: 3
Triglycerides: 49 mg/dL (ref 0.0–149.0)
VLDL: 9.8 mg/dL (ref 0.0–40.0)

## 2013-08-29 LAB — PSA: PSA: 2.13 ng/mL (ref 0.10–4.00)

## 2013-08-29 MED ORDER — LISINOPRIL-HYDROCHLOROTHIAZIDE 20-12.5 MG PO TABS: 1.0000 | ORAL_TABLET | Freq: Every day | ORAL | Status: DC

## 2013-08-29 NOTE — Patient Instructions (Addendum)
Please set up your colonoscopy.   Exercise to Stay Healthy Exercise helps you become and stay healthy. EXERCISE IDEAS AND TIPS Choose exercises that:  You enjoy.  Fit into your day. You do not need to exercise really hard to be healthy. You can do exercises at a slow or medium level and stay healthy. You can:  Stretch before and after working out.  Try yoga, Pilates, or tai chi.  Lift weights.  Walk fast, swim, jog, run, climb stairs, bicycle, dance, or rollerskate.  Take aerobic classes. Exercises that burn about 150 calories:  Running 1  miles in 15 minutes.  Playing volleyball for 45 to 60 minutes.  Washing and waxing a car for 45 to 60 minutes.  Playing touch football for 45 minutes.  Walking 1  miles in 35 minutes.  Pushing a stroller 1  miles in 30 minutes.  Playing basketball for 30 minutes.  Raking leaves for 30 minutes.  Bicycling 5 miles in 30 minutes.  Walking 2 miles in 30 minutes.  Dancing for 30 minutes.  Shoveling snow for 15 minutes.  Swimming laps for 20 minutes.  Walking up stairs for 15 minutes.  Bicycling 4 miles in 15 minutes.  Gardening for 30 to 45 minutes.  Jumping rope for 15 minutes.  Washing windows or floors for 45 to 60 minutes. Document Released: 08/26/2010 Document Revised: 10/16/2011 Document Reviewed: 08/26/2010 [facility] Patient Information 2014 [place], [place].  DASH Diet The DASH diet stands for "Dietary Approaches to Stop Hypertension." It is a healthy eating plan that has been shown to reduce high blood pressure (hypertension) in as little as 14 days, while also possibly providing other significant health benefits. These other health benefits include reducing the risk of breast cancer after menopause and reducing the risk of type 2 diabetes, heart disease, colon cancer, and stroke. Health benefits also include weight loss and slowing kidney failure in patients with chronic kidney disease.  DIET GUIDELINES  Limit  salt (sodium). Your diet should contain less than 1500 mg of sodium daily.  Limit refined or processed carbohydrates. Your diet should include mostly whole grains. Desserts and added sugars should be used sparingly.  Include small amounts of heart-healthy fats. These types of fats include nuts, oils, and tub margarine. Limit saturated and trans fats. These fats have been shown to be harmful in the body. CHOOSING FOODS  The following food groups are based on a 2000 calorie diet. See your Registered Dietitian for individual calorie needs. Grains and Grain Products (6 to 8 servings daily)  Eat More Often: Whole-wheat bread, brown rice, whole-grain or wheat pasta, quinoa, popcorn without added fat or salt (air popped).  Eat Less Often: White bread, white pasta, white rice, cornbread. Vegetables (4 to 5 servings daily)  Eat More Often: Fresh, frozen, and canned vegetables. Vegetables may be raw, steamed, roasted, or grilled with a minimal amount of fat.  Eat Less Often/Avoid: Creamed or fried vegetables. Vegetables in a cheese sauce. Fruit (4 to 5 servings daily)  Eat More Often: All fresh, canned (in natural juice), or frozen fruits. Dried fruits without added sugar. One hundred percent fruit juice ( cup [237 mL] daily).  Eat Less Often: Dried fruits with added sugar. Canned fruit in light or heavy syrup. [employer], Fish, and Poultry (2 servings or less daily. One serving is 3 to 4 oz [85-114 g]).  Eat More Often: Ninety percent or leaner ground beef, tenderloin, sirloin. Round cuts of beef, chicken breast, [place] breast. All fish.  Grill, bake, or broil your meat. Nothing should be fried.  Eat Less Often/Avoid: Fatty cuts of meat, [place], or chicken leg, thigh, or wing. Fried cuts of meat or fish. Dairy (2 to 3 servings)  Eat More Often: Low-fat or fat-free milk, low-fat plain or light yogurt, reduced-fat or part-skim cheese.  Eat Less Often/Avoid: Milk (whole, 2%).Whole milk yogurt.  Full-fat cheeses. Nuts, Seeds, and Legumes (4 to 5 servings per week)  Eat More Often: All without added salt.  Eat Less Often/Avoid: Salted nuts and seeds, canned beans with added salt. Fats and Sweets (limited)  Eat More Often: Vegetable oils, tub margarines without trans fats, sugar-free gelatin. Mayonnaise and salad dressings.  Eat Less Often/Avoid: Coconut oils, palm oils, butter, stick margarine, cream, half and half, cookies, candy, pie. FOR MORE INFORMATION The Dash Diet Eating Plan: www.dashdiet.org Document Released: 07/13/2011 Document Revised: 10/16/2011 Document Reviewed: 07/13/2011 [facility] Patient Information 2014 [place], [place].

## 2013-08-29 NOTE — Assessment & Plan Note (Signed)
Healthy Discussed weight and fitness Will check PSA after discussion

## 2013-08-29 NOTE — Assessment & Plan Note (Signed)
BP Readings from Last 3 Encounters:  08/29/13 122/80  01/17/13 118/80  10/22/12 142/98   Good control Due for labs

## 2013-08-29 NOTE — Progress Notes (Signed)
Pre-visit discussion using our clinic review tool. No additional management support is needed unless otherwise documented below in the visit note.  

## 2013-08-29 NOTE — Progress Notes (Signed)
Subjective:    Patient ID: Kurt Mccarthy, male    DOB: 1951/03/31, 63 y.o.   MRN: [id number]  HPI Here for physical Feels good Stays active and walks regularly Due for repeat colonoscopy--- he will set up Trying to keep control of his eating  Younger brothers with neurologic problems One with Parkinson's and the other with atypical MS  Current Outpatient Prescriptions on File Prior to Visit  Medication Sig Dispense Refill  . lisinopril-hydrochlorothiazide (PRINZIDE,ZESTORETIC) 20-12.5 MG per tablet Take 1 tablet by mouth daily.  90 tablet  1   No current facility-administered medications on file prior to visit.    Allergies  Allergen Reactions  . Penicillins     Past Medical History  Diagnosis Date  . Hypertension   . History of colonic polyps     Past Surgical History  Procedure Laterality Date  . Carpal tunnel release  03/07 & 04/07    right and left    Family History  Problem Relation Age of Onset  . Heart disease Maternal Grandfather   . Diabetes Paternal Grandfather   . Cancer Neg Hx   . Multiple sclerosis Brother   . Parkinson's disease Brother     History   Social History  . Marital Status: Married    Spouse Name: N/A    Number of Children: 1  . Years of Education: N/A   Occupational History  . [employer]    Social History Main Topics  . Smoking status: Never Smoker   . Smokeless tobacco: Never Used  . Alcohol Use: Yes  . Drug Use: No  . Sexual Activity: Not on file   Other Topics Concern  . Not on file   Social History Narrative  . No narrative on file   Review of Systems  Constitutional: Positive for unexpected weight change. Negative for fatigue.       Wears seat belt  HENT: Negative for dental problem, hearing loss and tinnitus.        Regular with dentist  Eyes: Negative for visual disturbance.       No diplopia or unilateral vision loss  Respiratory: Negative for cough, chest tightness and shortness of breath.     Cardiovascular: Positive for palpitations. Negative for chest pain and leg swelling.       Rare skipped beat  Gastrointestinal: Negative for nausea, vomiting, abdominal pain, constipation and blood in stool.  Endocrine: Negative for cold intolerance and heat intolerance.  Genitourinary: Positive for frequency. Negative for urgency.       Stable 2-3 per night nocturia No sexual problems  Musculoskeletal: Negative for arthralgias, gait problem and joint swelling.  Skin: Negative for rash.       Sees dermatologist regularly  Allergic/Immunologic: Positive for environmental allergies. Negative for immunocompromised state.       Mild symptoms---rare OTC med  Neurological: Negative for dizziness, syncope, weakness, light-headedness, numbness and headaches.  Hematological: Negative for adenopathy. Does not bruise/bleed easily.  Psychiatric/Behavioral: Negative for sleep disturbance and dysphoric mood. The patient is not nervous/anxious.        Objective:   Physical Exam  Constitutional: He is oriented to person, place, and time. He appears well-developed and well-nourished. No distress.  HENT:  Head: Normocephalic and atraumatic.  Right Ear: External ear normal.  Left Ear: External ear normal.  Mouth/Throat: Oropharynx is clear and moist. No oropharyngeal exudate.  Eyes: Conjunctivae and EOM are normal. Pupils are equal, round, and reactive to light.  Neck: Normal range  of motion. Neck supple. No thyromegaly present.  Cardiovascular: Normal rate, regular rhythm, normal heart sounds and intact distal pulses.  Exam reveals no gallop.   No murmur heard. Pulmonary/Chest: Effort normal and breath sounds normal. No respiratory distress. He has no wheezes. He has no rales.  Abdominal: Soft. There is no tenderness.  Musculoskeletal: He exhibits no edema and no tenderness.  Lymphadenopathy:    He has no cervical adenopathy.  Neurological: He is alert and oriented to person, place, and time.   Skin: No rash noted. No erythema.  Benign nevi  Psychiatric: He has a normal mood and affect. His behavior is normal.          Assessment & Plan:

## 2013-09-01 ENCOUNTER — Telehealth: Payer: Self-pay | Admitting: Internal Medicine

## 2013-09-01 NOTE — Telephone Encounter (Signed)
Relevant patient education assigned to patient using Emmi. ° °

## 2014-08-17 ENCOUNTER — Encounter: Payer: Self-pay | Admitting: Family Medicine

## 2014-08-17 ENCOUNTER — Ambulatory Visit (INDEPENDENT_AMBULATORY_CARE_PROVIDER_SITE_OTHER): Payer: BC Managed Care – PPO | Admitting: Family Medicine

## 2014-08-17 VITALS — BP 124/84 | HR 78 | Temp 98.4°F | Resp 18 | Ht 68.0 in | Wt 195.0 lb

## 2014-08-17 DIAGNOSIS — N410 Acute prostatitis: Secondary | ICD-10-CM

## 2014-08-17 DIAGNOSIS — J069 Acute upper respiratory infection, unspecified: Secondary | ICD-10-CM

## 2014-08-17 DIAGNOSIS — R350 Frequency of micturition: Secondary | ICD-10-CM

## 2014-08-17 LAB — POCT URINALYSIS DIPSTICK
Glucose, UA: NEGATIVE
Ketones, UA: NEGATIVE
Leukocytes, UA: NEGATIVE
Nitrite, UA: NEGATIVE
Protein, UA: 100
Spec Grav, UA: >=1.03
UROBILINOGEN UA: 0.2
pH, UA: 5.5

## 2014-08-17 MED ORDER — LEVOFLOXACIN 500 MG PO TABS: 500.0000 mg | ORAL_TABLET | Freq: Every day | ORAL | Status: DC

## 2014-08-17 MED ORDER — LISINOPRIL-HYDROCHLOROTHIAZIDE 20-12.5 MG PO TABS: 1.0000 | ORAL_TABLET | Freq: Every day | ORAL | Status: DC

## 2014-08-17 NOTE — Patient Instructions (Signed)
Try mucinex DM OTC and take as directed on packaging. Saline nasal spray OTC/generic to irrigate and moisturize your nasal passages.

## 2014-08-17 NOTE — Progress Notes (Signed)
OFFICE NOTE  08/17/2014  CC:  Chief Complaint  Patient presents with  . Nasal Congestion    x 1 week  . Urinary Urgency    HPI: Patient is a 64 y.o. Caucasian male who is here about 1 week of nasal congestion, sneezing, coughing, some mild HA.  No ST. Subjective fever at night, achiness in upper body that is mild.  Last 1-2 days he is stable: not improving but not worse.  Appetite is down.   Also has separate c/o urinary frequency: worse than his usual, gets up hourly at night.  Some daytime urgency that is that his usual.  No dysuria.  No nausea or lower abd pain.   No pain or discomfort in prostate/rectal area. Denies hx of bladder or prostate infection.  Pertinent PMH:  PMH and PSH reviewed.  MEDS:  Outpatient Prescriptions Prior to Visit  Medication Sig Dispense Refill  . lisinopril-hydrochlorothiazide (PRINZIDE,ZESTORETIC) 20-12.5 MG per tablet Take 1 tablet by mouth daily. 90 tablet 3   No facility-administered medications prior to visit.    PE: Blood pressure 124/84, pulse 78, temperature 98.4 F (36.9 C), temperature source Temporal, resp. rate 18, height 5\' 8"  (1.727 m), weight 195 lb (88.451 kg), SpO2 97 %. VS: noted--normal. Gen: alert, NAD, NONTOXIC APPEARING. HEENT: eyes without injection, drainage, or swelling.  Ears: EACs clear, TMs with normal light reflex and landmarks.  Nose: Clear rhinorrhea, with some dried, crusty exudate adherent to mildly injected mucosa.  No purulent d/c.  No paranasal sinus TTP.  No facial swelling.  Throat and mouth without focal lesion.  No pharyngial swelling, erythema, or exudate.   Neck: supple, no LAD.   LUNGS: CTA bilat, nonlabored resps.   CV: RRR, no m/r/g. EXT: no c/c/e SKIN: no rash Rectal exam: negative without mass, lesions or tenderness,  Prostate : normal in size, symmetrical, tenderness noted diffusely on palpation of prostate.  LAB: CC UA today showed small bili, SG >1.030, mod blood, 100 mg/dl  protein  IMPRESSION AND PLAN:  1) Viral URI with cough.  No sign of RAD or bacterial infection. Trial of mucinex DM or robitussin DM otc as directed on the box. May use OTC nasal saline spray or irrigation solution bid. Mucinex DM. Signs/symptoms to call or return for were reviewed and pt expressed understanding.   2) Acute prostatitis: levaquin 500 mg qd x 14d. Return to see PCP if all sx's not resolved at end of antibiotics course.   An After Visit Summary was printed and given to the patient.

## 2014-08-17 NOTE — Addendum Note (Signed)
Addended by: Ralph Dowdy on: 08/17/2014 03:01 PM   Modules accepted: Orders

## 2014-08-17 NOTE — Progress Notes (Signed)
Pre visit review using our clinic review tool, if applicable. No additional management support is needed unless otherwise documented below in the visit note. 

## 2014-08-18 ENCOUNTER — Ambulatory Visit: Payer: BC Managed Care – PPO | Admitting: Internal Medicine

## 2014-08-19 LAB — URINE CULTURE
Colony Count: NO GROWTH
Organism ID, Bacteria: NO GROWTH

## 2014-10-23 ENCOUNTER — Other Ambulatory Visit: Payer: Self-pay

## 2014-10-23 MED ORDER — LISINOPRIL-HYDROCHLOROTHIAZIDE 20-12.5 MG PO TABS: 1.0000 | ORAL_TABLET | Freq: Every day | ORAL | Status: DC

## 2014-10-23 NOTE — Telephone Encounter (Signed)
Lisinopril-hctz with 2 refills sent to [facility] on [address]

## 2014-10-23 NOTE — Telephone Encounter (Signed)
Approved:  Yes Can give 2 -- 90 day refills and that will hold him

## 2014-10-23 NOTE — Telephone Encounter (Signed)
Pt left v/m requesting refill lisinopril - hctz to [facility] [address]. Pt last seen 08/29/13 and pt had appt for CPX but appt was changed to 03/19/15 due to Dr Silvio Pate out of office. Is it OK to refill until next appt?

## 2014-11-10 ENCOUNTER — Other Ambulatory Visit: Payer: Self-pay | Admitting: Internal Medicine

## 2014-11-20 ENCOUNTER — Encounter: Payer: BC Managed Care – PPO | Admitting: Internal Medicine

## 2015-01-14 ENCOUNTER — Other Ambulatory Visit: Payer: Self-pay | Admitting: Internal Medicine

## 2015-03-19 ENCOUNTER — Encounter: Payer: Self-pay | Admitting: Internal Medicine

## 2015-03-19 ENCOUNTER — Ambulatory Visit (INDEPENDENT_AMBULATORY_CARE_PROVIDER_SITE_OTHER): Payer: BC Managed Care – PPO | Admitting: Internal Medicine

## 2015-03-19 VITALS — BP 128/80 | HR 68 | Temp 98.3°F | Ht 68.0 in | Wt 195.0 lb

## 2015-03-19 DIAGNOSIS — Z0000 Encounter for general adult medical examination without abnormal findings: Secondary | ICD-10-CM

## 2015-03-19 DIAGNOSIS — Z23 Encounter for immunization: Secondary | ICD-10-CM | POA: Diagnosis not present

## 2015-03-19 DIAGNOSIS — Z125 Encounter for screening for malignant neoplasm of prostate: Secondary | ICD-10-CM

## 2015-03-19 DIAGNOSIS — I10 Essential (primary) hypertension: Secondary | ICD-10-CM | POA: Diagnosis not present

## 2015-03-19 LAB — CBC WITH DIFFERENTIAL/PLATELET
Basophils Absolute: 0 10*3/uL (ref 0.0–0.1)
Basophils Relative: 0.3 % (ref 0.0–3.0)
Eosinophils Absolute: 0.1 10*3/uL (ref 0.0–0.7)
Eosinophils Relative: 2 % (ref 0.0–5.0)
HCT: 44.1 % (ref 39.0–52.0)
Hemoglobin: 14.6 g/dL (ref 13.0–17.0)
Lymphocytes Relative: 26.4 % (ref 12.0–46.0)
Lymphs Abs: 1.9 10*3/uL (ref 0.7–4.0)
MCHC: 33.2 g/dL (ref 30.0–36.0)
MCV: 81.6 fl (ref 78.0–100.0)
Monocytes Absolute: 0.6 10*3/uL (ref 0.1–1.0)
Monocytes Relative: 8.3 % (ref 3.0–12.0)
Neutro Abs: 4.6 10*3/uL (ref 1.4–7.7)
Neutrophils Relative %: 63 % (ref 43.0–77.0)
Platelets: 199 10*3/uL (ref 150.0–400.0)
RBC: 5.4 Mil/uL (ref 4.22–5.81)
RDW: 14.1 % (ref 11.5–15.5)
WBC: 7.4 10*3/uL (ref 4.0–10.5)

## 2015-03-19 LAB — COMPREHENSIVE METABOLIC PANEL
ALT: 13 U/L (ref 0–53)
AST: 19 U/L (ref 0–37)
Albumin: 4.1 g/dL (ref 3.5–5.2)
Alkaline Phosphatase: 67 U/L (ref 39–117)
BUN: 23 mg/dL (ref 6–23)
CO2: 25 mEq/L (ref 19–32)
Calcium: 9.7 mg/dL (ref 8.4–10.5)
Chloride: 104 mEq/L (ref 96–112)
Creatinine, Ser: 0.96 mg/dL (ref 0.40–1.50)
GFR: 83.6 mL/min (ref >60.00)
Glucose, Bld: 82 mg/dL (ref 70–99)
Potassium: 4.3 mEq/L (ref 3.5–5.1)
Sodium: 140 mEq/L (ref 135–145)
Total Bilirubin: 0.9 mg/dL (ref 0.2–1.2)
Total Protein: 6.9 g/dL (ref 6.0–8.3)

## 2015-03-19 LAB — PSA: PSA: 3.26 ng/mL (ref 0.10–4.00)

## 2015-03-19 MED ORDER — LISINOPRIL 20 MG PO TABS: 20.0000 mg | ORAL_TABLET | Freq: Every day | ORAL | Status: DC

## 2015-03-19 NOTE — Progress Notes (Signed)
Pre visit review using our clinic review tool, if applicable. No additional management support is needed unless otherwise documented below in the visit note. 

## 2015-03-19 NOTE — Assessment & Plan Note (Signed)
Healthy Will check PSA after discussion He has colonoscopy set for October Tdap today Flu vaccine yearly

## 2015-03-19 NOTE — Progress Notes (Signed)
Subjective:    Patient ID: Kurt Mccarthy, male    DOB: 08/07/1951, 64 y.o.   MRN: [id number]  HPI Here for physical  Has lost 10# since I saw him last Stays active at work, etc Rare beer  He is waiting for Medicare for the colonoscopy He is scheduled for October  Current Outpatient Prescriptions on File Prior to Visit  Medication Sig Dispense Refill  . lisinopril (PRINIVIL,ZESTRIL) 20 MG tablet TAKE ONE TABLET BY MOUTH ONCE DAILY FOR  HIGH  BLOOD  PRESSURE 90 tablet 0   No current facility-administered medications on file prior to visit.    Allergies  Allergen Reactions  . Penicillins     Past Medical History  Diagnosis Date  . Hypertension   . History of colonic polyps     Past Surgical History  Procedure Laterality Date  . Carpal tunnel release  03/07 & 04/07    right and left    Family History  Problem Relation Age of Onset  . Heart disease Maternal Grandfather   . Diabetes Paternal Grandfather   . Cancer Neg Hx   . Multiple sclerosis Brother   . Parkinson's disease Brother     Social History   Social History  . Marital Status: Married    Spouse Name: N/A  . Number of Children: 1  . Years of Education: N/A   Occupational History  . [employer]    Social History Main Topics  . Smoking status: Never Smoker   . Smokeless tobacco: Never Used  . Alcohol Use: Yes  . Drug Use: No  . Sexual Activity: Not on file   Other Topics Concern  . Not on file   Social History Narrative   Review of Systems  Constitutional: Negative for fatigue and unexpected weight change.       Wears seat belt  HENT: Negative for dental problem, hearing loss and tinnitus.        Keeps up with dentist  Eyes: Negative for visual disturbance.       No diplopia or unilateral vision loss  Respiratory: Positive for cough. Negative for chest tightness and shortness of breath.        Rare slight throat cough  Cardiovascular: Negative for chest pain, palpitations and leg  swelling.  Gastrointestinal: Negative for nausea, vomiting, abdominal pain, constipation and blood in stool.       No heartburn  Endocrine: Negative for polydipsia and polyuria.  Genitourinary:       Chronic nocturia No sexual problems  Musculoskeletal: Negative for back pain, joint swelling and arthralgias.  Skin: Negative for rash.       occ actinics--goes to derm regularly  Allergic/Immunologic: Positive for environmental allergies. Negative for immunocompromised state.       Very slight pollen sensitivity  Neurological: Negative for dizziness, syncope, weakness, light-headedness, numbness and headaches.  Hematological: Negative for adenopathy. Does not bruise/bleed easily.  Psychiatric/Behavioral: Negative for sleep disturbance and dysphoric mood. The patient is not nervous/anxious.        Objective:   Physical Exam  Constitutional: He is oriented to person, place, and time. He appears well-developed and well-nourished. No distress.  HENT:  Head: Normocephalic and atraumatic.  Right Ear: External ear normal.  Left Ear: External ear normal.  Mouth/Throat: Oropharynx is clear and moist.  Eyes: Conjunctivae and EOM are normal. Pupils are equal, round, and reactive to light.  Neck: Normal range of motion. Neck supple. No thyromegaly present.  Cardiovascular: Normal rate, regular  rhythm, normal heart sounds and intact distal pulses.  Exam reveals no gallop.   No murmur heard. Pulmonary/Chest: Effort normal and breath sounds normal. No respiratory distress. He has no wheezes. He has no rales.  Abdominal: Soft. There is no tenderness.  Musculoskeletal: He exhibits no edema or tenderness.  Lymphadenopathy:    He has no cervical adenopathy.  Neurological: He is alert and oriented to person, place, and time.  Skin: No rash noted. No erythema.  Psychiatric: He has a normal mood and affect. His behavior is normal.          Assessment & Plan:

## 2015-03-19 NOTE — Assessment & Plan Note (Signed)
BP Readings from Last 3 Encounters:  03/19/15 128/80  08/17/14 124/84  08/29/13 122/80   Good control No change needed

## 2015-03-19 NOTE — Addendum Note (Signed)
Addended by: Despina Hidden on: 03/19/2015 08:51 AM   Modules accepted: Orders

## 2015-06-25 ENCOUNTER — Ambulatory Visit (INDEPENDENT_AMBULATORY_CARE_PROVIDER_SITE_OTHER): Payer: PPO

## 2015-06-25 DIAGNOSIS — Z23 Encounter for immunization: Secondary | ICD-10-CM

## 2015-07-14 ENCOUNTER — Other Ambulatory Visit: Payer: Self-pay | Admitting: Gastroenterology

## 2015-07-14 LAB — HM COLONOSCOPY

## 2015-07-23 ENCOUNTER — Encounter: Payer: Self-pay | Admitting: Internal Medicine

## 2015-11-08 ENCOUNTER — Ambulatory Visit (INDEPENDENT_AMBULATORY_CARE_PROVIDER_SITE_OTHER): Payer: PPO | Admitting: Primary Care

## 2015-11-08 ENCOUNTER — Encounter: Payer: Self-pay | Admitting: Primary Care

## 2015-11-08 VITALS — BP 144/92 | HR 76 | Temp 98.5°F | Ht 68.0 in | Wt 204.8 lb

## 2015-11-08 DIAGNOSIS — Z125 Encounter for screening for malignant neoplasm of prostate: Secondary | ICD-10-CM

## 2015-11-08 DIAGNOSIS — J029 Acute pharyngitis, unspecified: Secondary | ICD-10-CM | POA: Diagnosis not present

## 2015-11-08 DIAGNOSIS — R509 Fever, unspecified: Secondary | ICD-10-CM

## 2015-11-08 LAB — CBC WITH DIFFERENTIAL/PLATELET
BASOS PCT: 0.3 % (ref 0.0–3.0)
Basophils Absolute: 0 10*3/uL (ref 0.0–0.1)
EOS PCT: 1.5 % (ref 0.0–5.0)
Eosinophils Absolute: 0.2 10*3/uL (ref 0.0–0.7)
HEMATOCRIT: 39.9 % (ref 39.0–52.0)
HEMOGLOBIN: 13.1 g/dL (ref 13.0–17.0)
LYMPHS PCT: 12.9 % (ref 12.0–46.0)
Lymphs Abs: 1.5 10*3/uL (ref 0.7–4.0)
MCHC: 33 g/dL (ref 30.0–36.0)
MCV: 80.2 fl (ref 78.0–100.0)
Monocytes Absolute: 0.7 10*3/uL (ref 0.1–1.0)
Monocytes Relative: 6.5 % (ref 3.0–12.0)
NEUTROS ABS: 9 10*3/uL — AB (ref 1.4–7.7)
Neutrophils Relative %: 78.8 % — ABNORMAL HIGH (ref 43.0–77.0)
PLATELETS: 248 10*3/uL (ref 150.0–400.0)
RBC: 4.98 Mil/uL (ref 4.22–5.81)
RDW: 13.9 % (ref 11.5–15.5)
WBC: 11.4 10*3/uL — AB (ref 4.0–10.5)

## 2015-11-08 LAB — POCT RAPID STREP A (OFFICE): Rapid Strep A Screen: NEGATIVE

## 2015-11-08 LAB — MONONUCLEOSIS SCREEN: Mono Screen: NEGATIVE

## 2015-11-08 LAB — PSA: PSA: 3.97 ng/mL (ref 0.10–4.00)

## 2015-11-08 NOTE — Progress Notes (Signed)
Subjective:    Patient ID: Kurt Mccarthy, male    DOB: 1950/11/16, 65 y.o.   MRN: [id number]  HPI  Kurt Mccarthy is a 65 year old male who presents today with a chief complaint of sore throat. He also reports fevers, cough, fatigue, headache. His symptoms have been present for the past 3 weeks. His fevers are intermittent as he had a fever of 101-102, 2 weeks ago, then fevers ranging 99.5-101 the following week, and has since then been running low grade fevers of 99.5. He's taken Aspirin today for a low grade fever, but has not taken anything else OTC for his symptoms. Denies sick contacts, unexplained weight loss. Overall he's feeling improved, but not 100%. He is concerned about his most recent PSA level in August and would like this rechecked.   Review of Systems  Constitutional: Positive for fever, chills and fatigue.  HENT: Positive for sore throat. Negative for congestion and sinus pressure.   Respiratory: Positive for cough. Negative for shortness of breath.   Cardiovascular: Negative for chest pain.  Genitourinary: Negative for dysuria, frequency and flank pain.  Musculoskeletal: Negative for myalgias.       Past Medical History  Diagnosis Date  . Hypertension   . History of colonic polyps     Social History   Social History  . Marital Status: Married    Spouse Name: N/A  . Number of Children: 1  . 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 Smoking status: Never Smoker   . Smokeless tobacco: Never Used  . Alcohol Use: Yes  . Drug Use: No  . Sexual Activity: Not on file   Other Topics Concern  . Not on file   Social History Narrative    Past Surgical History  Procedure Laterality Date  . Carpal tunnel release  03/07 & 04/07    right and left    Family History  Problem Relation Age of Onset  . Heart disease Maternal Grandfather   . Diabetes Paternal Grandfather   .  Cancer Neg Hx   . Multiple sclerosis Brother   . Parkinson's disease Brother     Allergies  Allergen Reactions  . Penicillins     Current Outpatient Prescriptions on File Prior to Visit  Medication Sig Dispense Refill  . lisinopril (PRINIVIL,ZESTRIL) 20 MG tablet Take 1 tablet (20 mg total) by mouth daily. 90 tablet 3   No current facility-administered medications on file prior to visit.    BP 144/92 mmHg  Pulse 76  Temp(Src) 98.5 F (36.9 C) (Oral)  Ht 5\' 8"  (1.727 m)  Wt 204 lb 12.8 oz (92.897 kg)  BMI 31.15 kg/m2  SpO2 95%    Objective:   Physical Exam  Constitutional: He appears well-nourished. He does not appear ill.  HENT:  Right Ear: Tympanic membrane and ear canal normal.  Left Ear: Tympanic membrane and ear canal normal.  Nose: No mucosal edema. Right sinus exhibits no maxillary sinus tenderness and no frontal sinus tenderness. Left sinus exhibits no maxillary sinus tenderness and no frontal sinus tenderness.  Mouth/Throat: Oropharynx is clear and moist.  Eyes: Conjunctivae are normal.  Neck: Neck supple.  Cardiovascular: Normal rate and regular rhythm.   Pulmonary/Chest: Effort normal and breath sounds normal. He has no wheezes. He has no rales.  Skin: Skin is warm and dry.  Assessment & Plan:  Fever of Unknown Origin:  Present intermittently for 3 weeks ranging low grade to 102. Overall feeling improved and fevers are low grade. Exam with clear lungs and unremarkable ENT exam. Does not appear ill. No urinary symptoms. Rapid strep negative. Will obtain CBC and mono to cover bases and to rule out other causes. Return precautions provided.  PSA testing:  Patient concerned regarding his last PSA reading in August. Explained this was completely normal and did not advise testing until upcoming physical. He would like to proceed with PSA check today. Asymptomatic for acute prostatitis.  PSA pending.

## 2015-11-08 NOTE — Progress Notes (Signed)
Pre visit review using our clinic review tool, if applicable. No additional management support is needed unless otherwise documented below in the visit note. 

## 2015-11-08 NOTE — Patient Instructions (Signed)
Complete lab work prior to leaving today. I will notify you of your results once received.   You may take Ibuprofen or Tylenol as needed for fevers.   Please notify me if no improvement in fevers in 1-2 weeks or if you notice increased fatigue and/or cough.   It was a pleasure meeting you!

## 2015-12-27 ENCOUNTER — Ambulatory Visit: Payer: PPO

## 2016-01-18 DIAGNOSIS — L570 Actinic keratosis: Secondary | ICD-10-CM | POA: Diagnosis not present

## 2016-01-18 DIAGNOSIS — D225 Melanocytic nevi of trunk: Secondary | ICD-10-CM | POA: Diagnosis not present

## 2016-01-18 DIAGNOSIS — L814 Other melanin hyperpigmentation: Secondary | ICD-10-CM | POA: Diagnosis not present

## 2016-01-18 DIAGNOSIS — D2271 Melanocytic nevi of right lower limb, including hip: Secondary | ICD-10-CM | POA: Diagnosis not present

## 2016-01-18 DIAGNOSIS — D1801 Hemangioma of skin and subcutaneous tissue: Secondary | ICD-10-CM | POA: Diagnosis not present

## 2016-01-18 DIAGNOSIS — D2272 Melanocytic nevi of left lower limb, including hip: Secondary | ICD-10-CM | POA: Diagnosis not present

## 2016-01-18 DIAGNOSIS — L821 Other seborrheic keratosis: Secondary | ICD-10-CM | POA: Diagnosis not present

## 2016-03-21 ENCOUNTER — Ambulatory Visit (INDEPENDENT_AMBULATORY_CARE_PROVIDER_SITE_OTHER): Payer: PPO | Admitting: Internal Medicine

## 2016-03-21 ENCOUNTER — Encounter: Payer: Self-pay | Admitting: Internal Medicine

## 2016-03-21 VITALS — BP 130/90 | HR 66 | Temp 98.1°F | Ht 67.75 in | Wt 195.5 lb

## 2016-03-21 DIAGNOSIS — I10 Essential (primary) hypertension: Secondary | ICD-10-CM

## 2016-03-21 DIAGNOSIS — Z1159 Encounter for screening for other viral diseases: Secondary | ICD-10-CM

## 2016-03-21 DIAGNOSIS — Z7189 Other specified counseling: Secondary | ICD-10-CM | POA: Diagnosis not present

## 2016-03-21 DIAGNOSIS — Z0000 Encounter for general adult medical examination without abnormal findings: Secondary | ICD-10-CM | POA: Diagnosis not present

## 2016-03-21 DIAGNOSIS — Z23 Encounter for immunization: Secondary | ICD-10-CM

## 2016-03-21 LAB — COMPREHENSIVE METABOLIC PANEL
ALT: 12 U/L (ref 0–53)
AST: 16 U/L (ref 0–37)
Albumin: 4.3 g/dL (ref 3.5–5.2)
Alkaline Phosphatase: 70 U/L (ref 39–117)
BILIRUBIN TOTAL: 0.7 mg/dL (ref 0.2–1.2)
BUN: 23 mg/dL (ref 6–23)
CALCIUM: 9.7 mg/dL (ref 8.4–10.5)
CO2: 29 meq/L (ref 19–32)
CREATININE: 1.04 mg/dL (ref 0.40–1.50)
Chloride: 104 mEq/L (ref 96–112)
GFR: 75.98 mL/min (ref >60.00)
Glucose, Bld: 82 mg/dL (ref 70–99)
Potassium: 3.7 mEq/L (ref 3.5–5.1)
Sodium: 138 mEq/L (ref 135–145)
Total Protein: 7 g/dL (ref 6.0–8.3)

## 2016-03-21 LAB — LIPID PANEL
Cholesterol: 173 mg/dL (ref 0–200)
HDL: 63.2 mg/dL (ref >39.00)
LDL Cholesterol: 96 mg/dL (ref 0–99)
NONHDL: 109.69
TRIGLYCERIDES: 68 mg/dL (ref 0.0–149.0)
Total CHOL/HDL Ratio: 3
VLDL: 13.6 mg/dL (ref 0.0–40.0)

## 2016-03-21 NOTE — Assessment & Plan Note (Signed)
I have personally reviewed the Medicare Annual Wellness questionnaire and have noted 1. The patient's medical and social history 2. Their use of alcohol, tobacco or illicit drugs 3. Their current medications and supplements 4. The patient's functional ability including ADL's, fall risks, home safety risks and hearing or visual             impairment. 5. Diet and physical activities 6. Evidence for depression or mood disorders  The patients weight, height, BMI and visual acuity have been recorded in the chart I have made referrals, counseling and provided education to the patient based review of the above and I have provided the pt with a written personalized care plan for preventive services.  I have provided you with a copy of your personalized plan for preventive services. Please take the time to review along with your updated medication list.  Healthy Discussed fitness Prevnar today Colon due 2021 Discussed PSA--will reconsider next year since he is worried about it No past tobacco so no AAA screening Due for flu later this fall

## 2016-03-21 NOTE — Assessment & Plan Note (Signed)
BP Readings from Last 3 Encounters:  03/21/16 130/90  11/08/15 (!) 144/92  03/19/15 128/80   Good control No change

## 2016-03-21 NOTE — Addendum Note (Signed)
Addended by: Emelia Salisbury C on: 03/21/2016 10:32 AM   Modules accepted: Orders

## 2016-03-21 NOTE — Progress Notes (Signed)
Subjective:    Patient ID: Kurt Mccarthy, male    DOB: Aug 24, 1950, 65 y.o.   MRN: [id number]  HPI Here for initial Medicare preventative exam and follow up of chronic medical conditions Reviewed form and advanced directives Reviewed other doctors --- eye -Dr Delman Cheadle. Dentist--[facility] Trixie Dredge) Interested in hep C screening. No recent blood donations (20 years) No set exercise but busy on property--cutting wood, etc. Also builds houses Occasional alcohol--beer Never used tobacco products Vision and hearing are fine No falls No depressed or anhedonia Independent with instrumental ADLs No sig cognitive problems  He is concerned about his PSA Does have chronic nocturia No sig urgency Mild increased frequency that are stable  Still on the lisinopril Will occasionally check BP-- 130/90 generally No chest pain No palpitations No dizziness or syncope No edema No cough  Current Outpatient Prescriptions on File Prior to Visit  Medication Sig Dispense Refill  . lisinopril (PRINIVIL,ZESTRIL) 20 MG tablet Take 1 tablet (20 mg total) by mouth daily. 90 tablet 3   No current facility-administered medications on file prior to visit.     Allergies  Allergen Reactions  . Penicillins     Past Medical History:  Diagnosis Date  . History of colonic polyps   . Hypertension     Past Surgical History:  Procedure Laterality Date  . CARPAL TUNNEL RELEASE  03/07 & 04/07   right and left    Family History  Problem Relation Age of Onset  . Heart disease Maternal Grandfather   . Diabetes Paternal Grandfather   . Heart disease Mother     MI  . Cancer Father     lung cancer from smoking  . Congestive Heart Failure Sister     1 sister  . Multiple sclerosis Brother   . Parkinson's disease Brother   . Parkinson's disease Brother   . Multiple sclerosis Brother     Social History   Social History  . Marital status: Married    Spouse name: N/A  . Number of  children: 1  . 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 Smoking status: Never Smoker  . Smokeless tobacco: Never Used  . Alcohol use Yes  . Drug use: No  . Sexual activity: Not on file   Other Topics Concern  . Not on file   Social History Narrative   No living will ---or isn't sure   Wife would be health care POA--then son   Would accept resuscitation   Wouldn't want prolonged tube feeds if cognitively unaware   Review of Systems Appetite is good Weight stable Sleeps well in general Wears seat belt Teeth are okay--keeps up with dentist Bowels are fine--no blood in stool. No joint swelling or pain No skin problems. Goes to [facility] annually No heartburn or dysphagia    Objective:   Physical Exam  Constitutional: He is oriented to person, place, and time. He appears well-developed and well-nourished. No distress.  HENT:  Mouth/Throat: Oropharynx is clear and moist. No oropharyngeal exudate.  Neck: Normal range of motion. Neck supple. No thyromegaly present.  Cardiovascular: Normal rate, regular rhythm, normal heart sounds and intact distal pulses.  Exam reveals no gallop.   No murmur heard. Pulmonary/Chest: Effort normal and breath sounds normal. No respiratory distress. He has no wheezes. He has no rales.  Abdominal: Soft. There is no tenderness.  Musculoskeletal: He exhibits no edema or tenderness.  Lymphadenopathy:    He has no cervical adenopathy.  Neurological: He is alert and oriented to person, place, and time.  President-- "Dwaine Deter, Bush" [phone] D-l-o-r-w Recall 3/3  Skin: No rash noted. No erythema.  Psychiatric: He has a normal mood and affect. His behavior is normal.          Assessment & Plan:

## 2016-03-21 NOTE — Progress Notes (Signed)
Pre visit review using our clinic review tool, if applicable. No additional management support is needed unless otherwise documented below in the visit note. 

## 2016-03-21 NOTE — Assessment & Plan Note (Signed)
See social history Blank forms given 

## 2016-03-22 DIAGNOSIS — D3131 Benign neoplasm of right choroid: Secondary | ICD-10-CM | POA: Diagnosis not present

## 2016-03-22 LAB — HEPATITIS C ANTIBODY: HCV AB: NEGATIVE

## 2016-03-23 DIAGNOSIS — D3131 Benign neoplasm of right choroid: Secondary | ICD-10-CM | POA: Diagnosis not present

## 2016-05-14 ENCOUNTER — Other Ambulatory Visit: Payer: Self-pay | Admitting: Internal Medicine

## 2016-05-24 ENCOUNTER — Ambulatory Visit (INDEPENDENT_AMBULATORY_CARE_PROVIDER_SITE_OTHER): Payer: PPO

## 2016-05-24 DIAGNOSIS — Z23 Encounter for immunization: Secondary | ICD-10-CM | POA: Diagnosis not present

## 2017-02-12 ENCOUNTER — Other Ambulatory Visit: Payer: Self-pay | Admitting: Internal Medicine

## 2017-04-23 DIAGNOSIS — L821 Other seborrheic keratosis: Secondary | ICD-10-CM | POA: Diagnosis not present

## 2017-04-23 DIAGNOSIS — D225 Melanocytic nevi of trunk: Secondary | ICD-10-CM | POA: Diagnosis not present

## 2017-04-23 DIAGNOSIS — L820 Inflamed seborrheic keratosis: Secondary | ICD-10-CM | POA: Diagnosis not present

## 2017-04-23 DIAGNOSIS — L570 Actinic keratosis: Secondary | ICD-10-CM | POA: Diagnosis not present

## 2017-04-23 DIAGNOSIS — D1801 Hemangioma of skin and subcutaneous tissue: Secondary | ICD-10-CM | POA: Diagnosis not present

## 2017-05-17 ENCOUNTER — Ambulatory Visit (INDEPENDENT_AMBULATORY_CARE_PROVIDER_SITE_OTHER): Payer: PPO

## 2017-05-17 DIAGNOSIS — Z23 Encounter for immunization: Secondary | ICD-10-CM | POA: Diagnosis not present

## 2017-06-03 ENCOUNTER — Other Ambulatory Visit: Payer: Self-pay | Admitting: Internal Medicine

## 2017-08-20 ENCOUNTER — Other Ambulatory Visit: Payer: Self-pay | Admitting: Internal Medicine

## 2018-04-23 DIAGNOSIS — L821 Other seborrheic keratosis: Secondary | ICD-10-CM | POA: Diagnosis not present

## 2018-04-23 DIAGNOSIS — D2272 Melanocytic nevi of left lower limb, including hip: Secondary | ICD-10-CM | POA: Diagnosis not present

## 2018-04-23 DIAGNOSIS — D225 Melanocytic nevi of trunk: Secondary | ICD-10-CM | POA: Diagnosis not present

## 2018-04-23 DIAGNOSIS — L570 Actinic keratosis: Secondary | ICD-10-CM | POA: Diagnosis not present

## 2018-04-23 DIAGNOSIS — D2271 Melanocytic nevi of right lower limb, including hip: Secondary | ICD-10-CM | POA: Diagnosis not present

## 2018-05-23 ENCOUNTER — Ambulatory Visit (INDEPENDENT_AMBULATORY_CARE_PROVIDER_SITE_OTHER): Payer: PPO

## 2018-05-23 DIAGNOSIS — Z23 Encounter for immunization: Secondary | ICD-10-CM | POA: Diagnosis not present

## 2018-05-27 ENCOUNTER — Other Ambulatory Visit: Payer: Self-pay | Admitting: Internal Medicine

## 2018-06-11 ENCOUNTER — Telehealth: Payer: Self-pay | Admitting: Internal Medicine

## 2018-06-11 MED ORDER — LISINOPRIL 20 MG PO TABS: 20.0000 mg | ORAL_TABLET | Freq: Every day | ORAL | 4 refills | Status: DC

## 2018-06-11 NOTE — Telephone Encounter (Signed)
Pt called in to get a refill on his lisinopril. Pt was last seen for his phy on 03/21/16. I was able to get the patient scheduled for 10/22/18 for his physical. Please advise

## 2018-06-11 NOTE — Telephone Encounter (Signed)
Refills sent to make it to his CPE in March 2020

## 2018-09-30 ENCOUNTER — Other Ambulatory Visit: Payer: Self-pay | Admitting: *Deleted

## 2018-09-30 MED ORDER — LISINOPRIL 20 MG PO TABS: 20.0000 mg | ORAL_TABLET | Freq: Every day | ORAL | 0 refills | Status: DC

## 2018-09-30 NOTE — Telephone Encounter (Signed)
Patient called stating that [facility] has requested a refill on his Lisinopril and has not heard back on this.  Last office visit 03/21/16 Patent stated that he has scheduled an appointment 10/22/18 and would like enough to last until his appointment.

## 2018-10-22 ENCOUNTER — Encounter: Payer: Self-pay | Admitting: Internal Medicine

## 2018-10-22 ENCOUNTER — Other Ambulatory Visit: Payer: Self-pay

## 2018-10-22 ENCOUNTER — Ambulatory Visit (INDEPENDENT_AMBULATORY_CARE_PROVIDER_SITE_OTHER): Payer: PPO | Admitting: Internal Medicine

## 2018-10-22 VITALS — BP 138/88 | HR 64 | Temp 97.8°F | Ht 67.25 in | Wt 199.0 lb

## 2018-10-22 DIAGNOSIS — I10 Essential (primary) hypertension: Secondary | ICD-10-CM | POA: Diagnosis not present

## 2018-10-22 DIAGNOSIS — Z0000 Encounter for general adult medical examination without abnormal findings: Secondary | ICD-10-CM

## 2018-10-22 DIAGNOSIS — S86911A Strain of unspecified muscle(s) and tendon(s) at lower leg level, right leg, initial encounter: Secondary | ICD-10-CM | POA: Diagnosis not present

## 2018-10-22 DIAGNOSIS — Z23 Encounter for immunization: Secondary | ICD-10-CM | POA: Diagnosis not present

## 2018-10-22 DIAGNOSIS — Z125 Encounter for screening for malignant neoplasm of prostate: Secondary | ICD-10-CM | POA: Diagnosis not present

## 2018-10-22 DIAGNOSIS — Z7189 Other specified counseling: Secondary | ICD-10-CM

## 2018-10-22 LAB — CBC
HCT: 44 % (ref 39.0–52.0)
Hemoglobin: 14.4 g/dL (ref 13.0–17.0)
MCHC: 32.8 g/dL (ref 30.0–36.0)
MCV: 82.2 fl (ref 78.0–100.0)
Platelets: 208 10*3/uL (ref 150.0–400.0)
RBC: 5.35 Mil/uL (ref 4.22–5.81)
RDW: 14.1 % (ref 11.5–15.5)
WBC: 7.1 10*3/uL (ref 4.0–10.5)

## 2018-10-22 LAB — COMPREHENSIVE METABOLIC PANEL
ALK PHOS: 78 U/L (ref 39–117)
ALT: 14 U/L (ref 0–53)
AST: 17 U/L (ref 0–37)
Albumin: 4 g/dL (ref 3.5–5.2)
BILIRUBIN TOTAL: 0.6 mg/dL (ref 0.2–1.2)
BUN: 24 mg/dL — AB (ref 6–23)
CO2: 30 meq/L (ref 19–32)
Calcium: 9.5 mg/dL (ref 8.4–10.5)
Chloride: 103 mEq/L (ref 96–112)
Creatinine, Ser: 0.93 mg/dL (ref 0.40–1.50)
GFR: 80.7 mL/min (ref >60.00)
GLUCOSE: 89 mg/dL (ref 70–99)
Potassium: 4.3 mEq/L (ref 3.5–5.1)
SODIUM: 139 meq/L (ref 135–145)
TOTAL PROTEIN: 6.4 g/dL (ref 6.0–8.3)

## 2018-10-22 LAB — PSA, MEDICARE: PSA: 5.47 ng/mL — AB (ref 0.10–4.00)

## 2018-10-22 LAB — LIPID PANEL
Cholesterol: 159 mg/dL (ref 0–200)
HDL: 57.9 mg/dL (ref >39.00)
LDL CALC: 84 mg/dL (ref 0–99)
NonHDL: 101.38
Total CHOL/HDL Ratio: 3
Triglycerides: 89 mg/dL (ref 0.0–149.0)
VLDL: 17.8 mg/dL (ref 0.0–40.0)

## 2018-10-22 MED ORDER — LISINOPRIL 20 MG PO TABS: 20.0000 mg | ORAL_TABLET | Freq: Every day | ORAL | 3 refills | Status: DC

## 2018-10-22 NOTE — Progress Notes (Signed)
Hearing Screening   Method: Audiometry   125Hz  250Hz  500Hz  1000Hz  2000Hz  3000Hz  4000Hz  6000Hz  8000Hz   Right ear:   20 20 20   0    Left ear:   20 25 0  0      Visual Acuity Screening   Right eye Left eye Both eyes  Without correction: 220/25 20/25 20/25   With correction:

## 2018-10-22 NOTE — Assessment & Plan Note (Signed)
See social history 

## 2018-10-22 NOTE — Assessment & Plan Note (Signed)
No obvious ligament or meniscus injury Significant effusion Discussed NSAIDs for 1-2 weeks Drain the fluid if not better then

## 2018-10-22 NOTE — Assessment & Plan Note (Signed)
BP Readings from Last 3 Encounters:  10/22/18 138/88  03/21/16 130/90  11/08/15 (!) 144/92   Good control Due for labs

## 2018-10-22 NOTE — Progress Notes (Signed)
Subjective:    Patient ID: Kurt Mccarthy, male    DOB: May 31, 1951, 68 y.o.   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 lisinopril (PRINIVIL,ZESTRIL) 20 MG tablet Take 1 tablet (20 mg total) by mouth daily. 30 tablet 0   No current facility-administered medications on file prior to visit.     Allergies  Allergen Reactions  . Penicillins     Past Medical History:  Diagnosis Date  . History of colonic polyps   . Hypertension     Past Surgical History:  Procedure Laterality Date  . CARPAL TUNNEL RELEASE  03/07 & 04/07   right and left    Family History  Problem Relation Age of Onset  . Heart disease Maternal Grandfather   . Diabetes Paternal Grandfather   . Heart disease Mother        MI  . Cancer Father        lung cancer from smoking  . Congestive Heart Failure Sister        1 sister  . Parkinson's disease Brother   . Multiple sclerosis Brother     Social History   Socioeconomic History  . Marital status: Married    Spouse name: Not on file  . Number of children: 1  . Years of education: Not on file  . Highest education level: Not on file  Occupational History  . Occupation: Personal assistant  . Occupation: Builds houses and  appraises  Social Needs  . Financial resource strain: Not on file  . Food insecurity:    Worry: Not on file    Inability: Not on file  . Transportation needs:    Medical: Not on file    Non-medical: Not on file  Tobacco Use  . Smoking status: Never Smoker  . Smokeless tobacco: Never Used  Substance and Sexual Activity  . Alcohol use: Yes  . Drug use: No  . Sexual activity: Not on file  Lifestyle  . Physical activity:    Days per week: Not on file    Minutes per session: Not on file  . Stress: Not on file  Relationships  . Social connections:    Talks on phone: Not on file    Gets together: Not on file    Attends religious service: Not on file    Active member of club or organization: Not on file    Attends meetings of clubs or organizations: Not on file    Relationship status: Not on file  . Intimate partner violence:    Fear of current or ex partner: Not on file    Emotionally abused: Not on file    Physically abused: Not on file    Forced sexual activity: Not on file  Other Topics Concern  . Not on  file  Social History Narrative   No living will ---or isn't sure   Wife would be health care POA--then son   Would accept resuscitation   Wouldn't want prolonged tube feeds if cognitively unaware   Review of Systems Appetite is fine Weight is stable Sleeps well usually---occasionally up at 3AM and trouble getting back to sleep Wears seat belt Teeth are fine--- recent root canals done Frequent cryotherapy by the dermatologist Bowels okay--did have some changes after antibiotics (and blood on toilet paper)--some months ago Voids okay. Nocturia x 2-3. Stream okay---occ mild dribbling No other back or joint pain No heartburn or dysphagia    Objective:   Physical Exam  Constitutional: He is oriented to person, place, and time. He appears well-developed. No distress.  HENT:  Mouth/Throat: Oropharynx is clear and moist. No oropharyngeal exudate.  Neck: No thyromegaly  present.  Cardiovascular: Normal rate, regular rhythm, normal heart sounds and intact distal pulses. Exam reveals no gallop.  No murmur heard. Respiratory: Effort normal and breath sounds normal. No respiratory distress. He has no wheezes. He has no rales.  GI: Soft. There is no abdominal tenderness.  Musculoskeletal:        General: No edema.     Comments: Left knee effusion ROM okay No ligament or meniscus findings No sig crepitus  Lymphadenopathy:    He has no cervical adenopathy.  Neurological: He is alert and oriented to person, place, and time.  President---"Donald Trump, Ophelia Shoulder" [id number] D-l-r-o-w Recall 3/3  Skin: No rash noted. No erythema.  Psychiatric: He has a normal mood and affect. His behavior is normal.           Assessment & Plan:

## 2018-10-22 NOTE — Patient Instructions (Addendum)
I would recommend the shingrix vaccine---- you will have to check with your pharmacist. Please try the ibuprofen 400mg  three times a day with food for the next 1-2 weeks. If your knee swelling is not better, set up an appointment for me to drain it and put in a cortisone shot. You can try ice intermittently as well as a compression brace.

## 2018-10-22 NOTE — Assessment & Plan Note (Addendum)
I have personally reviewed the Medicare Annual Wellness questionnaire and have noted 1. The patient's medical and social history 2. Their use of alcohol, tobacco or illicit drugs 3. Their current medications and supplements 4. The patient's functional ability including ADL's, fall risks, home safety risks and hearing or visual             impairment. 5. Diet and physical activities 6. Evidence for depression or mood disorders  The patients weight, height, BMI and visual acuity have been recorded in the chart I have made referrals, counseling and provided education to the patient based review of the above and I have provided the pt with a written personalized care plan for preventive services.  I have provided you with a copy of your personalized plan for preventive services. Please take the time to review along with your updated medication list.  Will check PSA Colon due 2021---would push up if any anemia Discussed resistance training Yearly flu vaccine Pneumovax today Consider shingrix

## 2018-10-22 NOTE — Addendum Note (Signed)
Addended by: Pilar Grammes on: 10/22/2018 12:19 PM   Modules accepted: Orders

## 2018-10-23 ENCOUNTER — Other Ambulatory Visit: Payer: Self-pay | Admitting: Internal Medicine

## 2018-10-23 DIAGNOSIS — R972 Elevated prostate specific antigen [PSA]: Secondary | ICD-10-CM

## 2018-10-31 ENCOUNTER — Ambulatory Visit: Payer: PPO | Admitting: Internal Medicine

## 2018-10-31 ENCOUNTER — Encounter: Payer: Self-pay | Admitting: Internal Medicine

## 2018-10-31 ENCOUNTER — Other Ambulatory Visit: Payer: Self-pay

## 2018-10-31 ENCOUNTER — Ambulatory Visit (INDEPENDENT_AMBULATORY_CARE_PROVIDER_SITE_OTHER): Payer: PPO | Admitting: Internal Medicine

## 2018-10-31 VITALS — BP 134/90 | HR 76 | Temp 97.9°F | Ht 67.25 in | Wt 202.0 lb

## 2018-10-31 DIAGNOSIS — M25462 Effusion, left knee: Secondary | ICD-10-CM | POA: Insufficient documentation

## 2018-10-31 MED ORDER — METHYLPREDNISOLONE ACETATE 40 MG/ML IJ SUSP
40.0000 mg | Freq: Once | INTRAMUSCULAR | Status: AC
  Administered 2018-10-31: 40 mg via INTRA_ARTICULAR

## 2018-10-31 NOTE — Addendum Note (Signed)
Addended by: Pilar Grammes on: 10/31/2018 11:42 AM   Modules accepted: Orders

## 2018-10-31 NOTE — Assessment & Plan Note (Signed)
Discussed alternatives Verbal consent Sterile prep----superomedial approach Ethyl chloride then 2cc 1% plain lidocaine Only slight clear synovial fluid return 40mg  depomedrol/6cc 1% lidocaine instilled Tolerated well Some pain relief noted  Discussed home care Consider ortho if persists or worsens

## 2018-10-31 NOTE — Progress Notes (Signed)
Subjective:    Patient ID: Kurt Mccarthy, male    DOB: [birth year], 68 y.o.   MRN: [id number]  HPI Here due to persistent left knee pain Still swollen but may be some better Using the ibuprofen 400 tid Using compression brace  Interested in getting it drained and cortisone  Current Outpatient Medications on File Prior to Visit  Medication Sig Dispense Refill  . lisinopril (PRINIVIL,ZESTRIL) 20 MG tablet Take 1 tablet (20 mg total) by mouth daily. 90 tablet 3   No current facility-administered medications on file prior to visit.     Allergies  Allergen Reactions  . Penicillins     Past Medical History:  Diagnosis Date  . History of colonic polyps   . Hypertension     Past Surgical History:  Procedure Laterality Date  . CARPAL TUNNEL RELEASE  03/07 & 04/07   right and left    Family History  Problem Relation Age of Onset  . Heart disease Maternal Grandfather   . Diabetes Paternal Grandfather   . Heart disease Mother        MI  . Cancer Father        lung cancer from smoking  . Congestive Heart Failure Sister        1 sister  . Parkinson's disease Brother   . Multiple sclerosis Brother     Social History   Socioeconomic History  . Marital status: Married    Spouse name: Not on file  . Number of children: 1  . Years of education: Not on file  . Highest education level: Not on file  Occupational History  . Occupation: Personal assistant  . Occupation: Builds houses and appraises  Social Needs  . Financial resource strain: Not on file  . Food insecurity:    Worry: Not on file    Inability: Not on file  . Transportation needs:    Medical: Not on file    Non-medical: Not on file  Tobacco Use  . Smoking status: Never Smoker  . Smokeless tobacco: Never Used  Substance and Sexual Activity  . Alcohol use: Yes  . Drug use: No  . Sexual activity: Not on file  Lifestyle  . Physical activity:    Days per week: Not on file    Minutes per session: Not on  file  . Stress: Not on file  Relationships  . Social connections:    Talks on phone: Not on file    Gets together: Not on file    Attends religious service: Not on file    Active member of club or organization: Not on file    Attends meetings of clubs or organizations: Not on file    Relationship status: Not on file  . Intimate partner violence:    Fear of current or ex partner: Not on file    Emotionally abused: Not on file    Physically abused: Not on file    Forced sexual activity: Not on file  Other Topics Concern  . Not on file  Social History Narrative   No living will ---or isn't sure   Wife would be health care POA--then son   Would accept resuscitation   Wouldn't want prolonged tube feeds if cognitively unaware   Review of Systems     Objective:   Physical Exam  Musculoskeletal:     Comments: Still swelling around left knee No ligament or meniscus findings still  Assessment & Plan:

## 2018-11-05 DIAGNOSIS — M25562 Pain in left knee: Secondary | ICD-10-CM

## 2018-11-20 DIAGNOSIS — M25562 Pain in left knee: Secondary | ICD-10-CM | POA: Diagnosis not present

## 2018-12-13 DIAGNOSIS — R52 Pain, unspecified: Secondary | ICD-10-CM | POA: Diagnosis not present

## 2018-12-13 DIAGNOSIS — M79645 Pain in left finger(s): Secondary | ICD-10-CM | POA: Diagnosis not present

## 2018-12-13 DIAGNOSIS — M19042 Primary osteoarthritis, left hand: Secondary | ICD-10-CM | POA: Diagnosis not present

## 2019-01-28 ENCOUNTER — Other Ambulatory Visit: Payer: Self-pay

## 2019-01-28 ENCOUNTER — Other Ambulatory Visit (INDEPENDENT_AMBULATORY_CARE_PROVIDER_SITE_OTHER): Payer: PPO

## 2019-01-28 DIAGNOSIS — R972 Elevated prostate specific antigen [PSA]: Secondary | ICD-10-CM

## 2019-01-29 LAB — PSA, TOTAL AND FREE
PSA, % Free: 8 % (calc) — ABNORMAL LOW (ref >25)
PSA, Free: 0.5 ng/mL
PSA, Total: 6.1 ng/mL — ABNORMAL HIGH (ref <=4.0)

## 2019-04-29 DIAGNOSIS — L821 Other seborrheic keratosis: Secondary | ICD-10-CM | POA: Diagnosis not present

## 2019-04-29 DIAGNOSIS — C4441 Basal cell carcinoma of skin of scalp and neck: Secondary | ICD-10-CM | POA: Diagnosis not present

## 2019-04-29 DIAGNOSIS — L570 Actinic keratosis: Secondary | ICD-10-CM | POA: Diagnosis not present

## 2019-04-29 DIAGNOSIS — D225 Melanocytic nevi of trunk: Secondary | ICD-10-CM | POA: Diagnosis not present

## 2019-04-29 DIAGNOSIS — C44319 Basal cell carcinoma of skin of other parts of face: Secondary | ICD-10-CM | POA: Diagnosis not present

## 2019-04-29 DIAGNOSIS — D485 Neoplasm of uncertain behavior of skin: Secondary | ICD-10-CM | POA: Diagnosis not present

## 2019-04-29 DIAGNOSIS — D1801 Hemangioma of skin and subcutaneous tissue: Secondary | ICD-10-CM | POA: Diagnosis not present

## 2019-05-01 ENCOUNTER — Ambulatory Visit (INDEPENDENT_AMBULATORY_CARE_PROVIDER_SITE_OTHER): Payer: PPO

## 2019-05-01 DIAGNOSIS — Z23 Encounter for immunization: Secondary | ICD-10-CM | POA: Diagnosis not present

## 2019-05-20 DIAGNOSIS — Z85828 Personal history of other malignant neoplasm of skin: Secondary | ICD-10-CM | POA: Diagnosis not present

## 2019-05-20 DIAGNOSIS — C44319 Basal cell carcinoma of skin of other parts of face: Secondary | ICD-10-CM | POA: Diagnosis not present

## 2019-09-29 ENCOUNTER — Ambulatory Visit: Payer: PPO | Attending: Internal Medicine

## 2019-09-29 DIAGNOSIS — Z23 Encounter for immunization: Secondary | ICD-10-CM | POA: Insufficient documentation

## 2019-09-29 NOTE — Progress Notes (Signed)
   Covid-19 Vaccination Clinic  Name:  Kurt Mccarthy    MRN: [id number] DOB: 10/19/1950  09/29/2019  [name] was observed post Covid-19 immunization for 15 minutes without incidence. He was provided with Vaccine Information Sheet and instruction to access the V-Safe system.   [name] was instructed to call 911 with any severe reactions post vaccine: Marland Kitchen Difficulty breathing  . Swelling of your face and throat  . A fast heartbeat  . A bad rash all over your body  . Dizziness and weakness    Immunizations Administered    Name Date Dose VIS Date Route   Pfizer COVID-19 Vaccine 09/29/2019  8:25 AM 0.3 mL 07/18/2019 Intramuscular   Manufacturer: [place]   Lot: [id number]   [employer]: [id number]

## 2019-10-21 ENCOUNTER — Ambulatory Visit: Payer: PPO | Attending: Internal Medicine

## 2019-10-21 DIAGNOSIS — Z23 Encounter for immunization: Secondary | ICD-10-CM

## 2019-10-21 NOTE — Progress Notes (Signed)
   Covid-19 Vaccination Clinic  Name:  Kurt Mccarthy    MRN: [id number] DOB: Oct 02, 1950  10/21/2019  Kurt Mccarthy was observed post Covid-19 immunization for 15 minutes without incident. He was provided with Vaccine Information Sheet and instruction to access the V-Safe system.   Kurt Mccarthy was instructed to call 911 with any severe reactions post vaccine: Marland Kitchen Difficulty breathing  . Swelling of face and throat  . A fast heartbeat  . A bad rash all over body  . Dizziness and weakness   Immunizations Administered    Name Date Dose VIS Date Route   Pfizer COVID-19 Vaccine 10/21/2019  9:04 AM 0.3 mL 07/18/2019 Intramuscular   Manufacturer: [place]   Lot: [id number]   [place]: [id number]

## 2019-10-28 ENCOUNTER — Encounter: Payer: PPO | Admitting: Internal Medicine

## 2019-10-31 DIAGNOSIS — H66002 Acute suppurative otitis media without spontaneous rupture of ear drum, left ear: Secondary | ICD-10-CM | POA: Diagnosis not present

## 2019-10-31 DIAGNOSIS — I10 Essential (primary) hypertension: Secondary | ICD-10-CM | POA: Diagnosis not present

## 2020-01-07 ENCOUNTER — Other Ambulatory Visit: Payer: Self-pay | Admitting: Internal Medicine

## 2020-03-02 ENCOUNTER — Other Ambulatory Visit: Payer: Self-pay

## 2020-04-29 ENCOUNTER — Ambulatory Visit (INDEPENDENT_AMBULATORY_CARE_PROVIDER_SITE_OTHER): Payer: PPO

## 2020-04-29 ENCOUNTER — Other Ambulatory Visit: Payer: Self-pay

## 2020-04-29 ENCOUNTER — Telehealth: Payer: Self-pay | Admitting: Internal Medicine

## 2020-04-29 DIAGNOSIS — R972 Elevated prostate specific antigen [PSA]: Secondary | ICD-10-CM

## 2020-04-29 DIAGNOSIS — Z23 Encounter for immunization: Secondary | ICD-10-CM

## 2020-04-29 DIAGNOSIS — I10 Essential (primary) hypertension: Secondary | ICD-10-CM

## 2020-04-29 NOTE — Telephone Encounter (Signed)
Okay to set him up with fasting blood work soon. Let him know that I included the other tests I would expect to need in December--so we wouldn't have to repeat labs then

## 2020-04-29 NOTE — Telephone Encounter (Signed)
Pt called to schedule cpx 12/29.  He wanted to know if he could get a lab for his PSA.  He stated last time it was up a little and he wanted to get it checked before dec.  If its still elevated he wanted to see urology

## 2020-04-30 ENCOUNTER — Other Ambulatory Visit (INDEPENDENT_AMBULATORY_CARE_PROVIDER_SITE_OTHER): Payer: PPO

## 2020-04-30 DIAGNOSIS — R972 Elevated prostate specific antigen [PSA]: Secondary | ICD-10-CM | POA: Diagnosis not present

## 2020-04-30 DIAGNOSIS — I10 Essential (primary) hypertension: Secondary | ICD-10-CM

## 2020-04-30 LAB — COMPREHENSIVE METABOLIC PANEL
ALT: 11 U/L (ref 0–53)
AST: 15 U/L (ref 0–37)
Albumin: 3.9 g/dL (ref 3.5–5.2)
Alkaline Phosphatase: 66 U/L (ref 39–117)
BUN: 24 mg/dL — ABNORMAL HIGH (ref 6–23)
CO2: 27 mEq/L (ref 19–32)
Calcium: 9.3 mg/dL (ref 8.4–10.5)
Chloride: 103 mEq/L (ref 96–112)
Creatinine, Ser: 0.89 mg/dL (ref 0.40–1.50)
GFR: 84.52 mL/min (ref >60.00)
Glucose, Bld: 70 mg/dL (ref 70–99)
Potassium: 3.8 mEq/L (ref 3.5–5.1)
Sodium: 138 mEq/L (ref 135–145)
Total Bilirubin: 0.8 mg/dL (ref 0.2–1.2)
Total Protein: 6.3 g/dL (ref 6.0–8.3)

## 2020-04-30 LAB — LIPID PANEL
Cholesterol: 148 mg/dL (ref 0–200)
HDL: 58.6 mg/dL (ref >39.00)
LDL Cholesterol: 77 mg/dL (ref 0–99)
NonHDL: 89.42
Total CHOL/HDL Ratio: 3
Triglycerides: 61 mg/dL (ref 0.0–149.0)
VLDL: 12.2 mg/dL (ref 0.0–40.0)

## 2020-04-30 LAB — CBC
HCT: 41.7 % (ref 39.0–52.0)
Hemoglobin: 13.6 g/dL (ref 13.0–17.0)
MCHC: 32.7 g/dL (ref 30.0–36.0)
MCV: 83.7 fl (ref 78.0–100.0)
Platelets: 201 10*3/uL (ref 150.0–400.0)
RBC: 4.98 Mil/uL (ref 4.22–5.81)
RDW: 13.7 % (ref 11.5–15.5)
WBC: 7.4 10*3/uL (ref 4.0–10.5)

## 2020-04-30 LAB — PSA: PSA: 6.55 ng/mL — ABNORMAL HIGH (ref 0.10–4.00)

## 2020-04-30 NOTE — Telephone Encounter (Signed)
Patient scheduled appointment for today at 1:00.

## 2020-05-04 DIAGNOSIS — L821 Other seborrheic keratosis: Secondary | ICD-10-CM | POA: Diagnosis not present

## 2020-05-04 DIAGNOSIS — L570 Actinic keratosis: Secondary | ICD-10-CM | POA: Diagnosis not present

## 2020-05-04 DIAGNOSIS — L578 Other skin changes due to chronic exposure to nonionizing radiation: Secondary | ICD-10-CM | POA: Diagnosis not present

## 2020-05-04 DIAGNOSIS — Z85828 Personal history of other malignant neoplasm of skin: Secondary | ICD-10-CM | POA: Diagnosis not present

## 2020-05-04 DIAGNOSIS — D485 Neoplasm of uncertain behavior of skin: Secondary | ICD-10-CM | POA: Diagnosis not present

## 2020-05-04 DIAGNOSIS — D1801 Hemangioma of skin and subcutaneous tissue: Secondary | ICD-10-CM | POA: Diagnosis not present

## 2020-06-01 DIAGNOSIS — R040 Epistaxis: Secondary | ICD-10-CM | POA: Diagnosis not present

## 2020-06-01 DIAGNOSIS — J342 Deviated nasal septum: Secondary | ICD-10-CM | POA: Diagnosis not present

## 2020-06-01 DIAGNOSIS — H9193 Unspecified hearing loss, bilateral: Secondary | ICD-10-CM | POA: Diagnosis not present

## 2020-07-09 ENCOUNTER — Ambulatory Visit: Payer: PPO | Attending: Internal Medicine

## 2020-07-09 DIAGNOSIS — Z23 Encounter for immunization: Secondary | ICD-10-CM

## 2020-07-09 NOTE — Progress Notes (Signed)
   Covid-19 Vaccination Clinic  Name:  Kurt Mccarthy    MRN: [id number] DOB: 03-Mar-1951  07/09/2020  Kurt Mccarthy was observed post Covid-19 immunization for 30 minutes based on pre-vaccination screening without incident. He was provided with Vaccine Information Sheet and instruction to access the V-Safe system.   Kurt Mccarthy was instructed to call 911 with any severe reactions post vaccine: Marland Kitchen Difficulty breathing  . Swelling of face and throat  . A fast heartbeat  . A bad rash all over body  . Dizziness and weakness   Immunizations Administered    Name Date Dose VIS Date Route   Pfizer COVID-19 Vaccine 07/09/2020  3:22 PM 0.3 mL 05/26/2020 Intramuscular   Manufacturer: [place]   Lot: [id number]   NDC: [address]-5

## 2020-08-04 ENCOUNTER — Encounter: Payer: Self-pay | Admitting: Internal Medicine

## 2020-08-04 ENCOUNTER — Other Ambulatory Visit: Payer: Self-pay

## 2020-08-04 ENCOUNTER — Ambulatory Visit (INDEPENDENT_AMBULATORY_CARE_PROVIDER_SITE_OTHER): Payer: PPO | Admitting: Internal Medicine

## 2020-08-04 VITALS — BP 130/90 | HR 72 | Temp 97.5°F | Ht 67.0 in | Wt 184.0 lb

## 2020-08-04 DIAGNOSIS — Z7189 Other specified counseling: Secondary | ICD-10-CM

## 2020-08-04 DIAGNOSIS — Z0000 Encounter for general adult medical examination without abnormal findings: Secondary | ICD-10-CM

## 2020-08-04 DIAGNOSIS — N138 Other obstructive and reflux uropathy: Secondary | ICD-10-CM

## 2020-08-04 DIAGNOSIS — R972 Elevated prostate specific antigen [PSA]: Secondary | ICD-10-CM | POA: Diagnosis not present

## 2020-08-04 DIAGNOSIS — N401 Enlarged prostate with lower urinary tract symptoms: Secondary | ICD-10-CM | POA: Diagnosis not present

## 2020-08-04 DIAGNOSIS — I10 Essential (primary) hypertension: Secondary | ICD-10-CM

## 2020-08-04 NOTE — Assessment & Plan Note (Addendum)
I have personally reviewed the Medicare Annual Wellness questionnaire and have noted  1. The patient's medical and social history  2. Their use of alcohol, tobacco or illicit drugs  3. Their current medications and supplements  4. The patient's functional ability including ADL's, fall risks, home safety risks and hearing or visual              impairment.  5. Diet and physical activities  6. Evidence for depression or mood disorders  The patients weight, height, BMI and visual acuity have been recorded in the chart  I have made referrals, counseling and provided education to the patient based review of the above and I have provided the pt with a written personalized care plan for preventive services.   I have provided you with a copy of your personalized plan for preventive services. Please take the time to review along with your updated medication list.  Works on fitness Had COVID booster and flu vaccine Consider shingrix at pharmacy Urology for PSA Had past colon polyps--but 2016 colon only had hyperplastic polyp. Should repeat probably by 2023

## 2020-08-04 NOTE — Progress Notes (Signed)
Subjective:    Patient ID: Kurt Mccarthy, male    DOB: 1951/06/28, 69 y.o.   MRN: [id number]  HPI Here for Medicare wellness visit and follow up of chronic health conditions This visit occurred during the SARS-CoV-2 public health emergency.  Safety protocols were in place, including screening questions prior to the visit, additional usage of staff PPE, and extensive cleaning of exam room while observing appropriate contact time as indicated for disinfecting solutions.   Reviewed advanced directives Reviewed other doctors----Dr Jones--dermatology, Dr Cheyenne Adas No hospitalizations or surgery in the past year. Will have an occasional beer No tobacco Still building houses---very active. Walks 10,000 or more steps a day No falls No depression or anhedonia Vision is fine Mild hearing issues---nothing worrisome. Independent with instrumental ADLs No memory issues  Discussed the increasing PSA I feel urology may be appropriate at this time  Stopped the BP med a few months ago Didn't notice a difference on or off it Lost 15#---cutting back on calories Home BP usually 120-130/80's No chest pain or SOB Exercise tolerance is good No dizziness or syncope No edema No palpitations  Right knee is okay Probably just some arthritis Got Rx for ortho--but didn't take it Did improve  No current outpatient medications on file prior to visit.   No current facility-administered medications on file prior to visit.    Allergies  Allergen Reactions  . Penicillins     Past Medical History:  Diagnosis Date  . History of colonic polyps   . Hypertension     Past Surgical History:  Procedure Laterality Date  . CARPAL TUNNEL RELEASE  03/07 & 04/07   right and left    Family History  Problem Relation Age of Onset  . Heart disease Maternal Grandfather   . Diabetes Paternal Grandfather   . Heart disease Mother        MI  . Cancer Father        lung cancer from smoking  .  Congestive Heart Failure Sister        1 sister  . Parkinson's disease Brother   . Multiple sclerosis Brother     Social History   Socioeconomic History  . Marital status: Married    Spouse name: Not on file  . Number of children: 1  . Years of education: Not on file  . Highest education level: Not on file  Occupational History  . Occupation: Research officer, political party  . Occupation: Builds houses and appraises  Tobacco Use  . Smoking status: Never Smoker  . Smokeless tobacco: Never Used  Substance and Sexual Activity  . Alcohol use: Yes  . Drug use: No  . Sexual activity: Not on file  Other Topics Concern  . Not on file  Social History Narrative   No living will ---or isn't sure   Wife would be health care POA--then son   Would accept resuscitation   Wouldn't want prolonged tube feeds if cognitively unaware   Social Determinants of Health   Financial Resource Strain: Not on file  Food Insecurity: Not on file  Transportation Needs: Not on file  Physical Activity: Not on file  Stress: Not on file  Social Connections: Not on file  Intimate Partner Violence: Not on file   Review of Systems Appetite is great Sleeps okay---up 3-4 times a night to void. Some daytime frequency but not really urgent. Flow is okay Teeth okay---keeps up with dentist No heartburn or dysphagia Bowels are fine. 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 Seen by ENT---advised saline spray    Objective:   Physical Exam Constitutional:      Appearance: Normal appearance.  HENT:     Mouth/Throat:     Pharynx: No oropharyngeal exudate or posterior oropharyngeal erythema.  Eyes:     Conjunctiva/sclera: Conjunctivae normal.     Pupils: Pupils are equal, round, and reactive to light.  Cardiovascular:     Rate and Rhythm: Normal rate and regular rhythm.     Pulses: Normal pulses.     Heart  sounds: No murmur heard. No gallop.   Pulmonary:     Effort: Pulmonary effort is normal.     Breath sounds: Normal breath sounds. No wheezing or rales.  Abdominal:     Palpations: Abdomen is soft.     Tenderness: There is no abdominal tenderness.  Musculoskeletal:     Cervical back: Neck supple.     Right lower leg: No edema.     Left lower leg: No edema.  Lymphadenopathy:     Cervical: No cervical adenopathy.  Skin:    General: Skin is warm.     Findings: No rash.  Neurological:     Mental Status: He is alert and oriented to person, place, and time.     Comments: President--- "Kurt Mccarthy, Obama" [id number] D-l-r-o-w Recall 3/3  Psychiatric:        Mood and Affect: Mood normal.        Behavior: Behavior normal.            Assessment & Plan:

## 2020-08-04 NOTE — Assessment & Plan Note (Signed)
BP Readings from Last 3 Encounters:  08/04/20 130/90  10/31/18 134/90  10/22/18 138/88   Doing well now without the med----since lifestyle changes Will hold off but restart the lisinopril if home BP goes up

## 2020-08-04 NOTE — Assessment & Plan Note (Signed)
Has been progressively increasing Will go ahead with urology evaluation after discussion

## 2020-08-04 NOTE — Assessment & Plan Note (Signed)
See social history 

## 2020-08-04 NOTE — Assessment & Plan Note (Signed)
Some frequency and nocturia Will consider tamsulosin if worsens

## 2020-08-04 NOTE — Progress Notes (Signed)
Hearing Screening   Method: Audiometry   125Hz  250Hz  500Hz  1000Hz  2000Hz  3000Hz  4000Hz  6000Hz  8000Hz   Right ear:   20 25 20  25     Left ear:   20 0 0  0      Visual Acuity Screening   Right eye Left eye Both eyes  Without correction: 20/25 20/25 20/25   With correction:

## 2020-09-02 ENCOUNTER — Telehealth: Payer: Self-pay | Admitting: Internal Medicine

## 2020-09-02 NOTE — Telephone Encounter (Signed)
I have routed his PSA labs to her.

## 2020-09-02 NOTE — Telephone Encounter (Signed)
Kurt Mccarthy CALLED AND STATED THAT THEY RECEIVED A REFERRAL FOR ELEVATED PSA BUT NEVER RECEIVED THE LABS FOR THE PSA AND THE NEED THE LAST 3 LABS OF THE PSA   FAX NUMBER: [phone]

## 2020-09-13 DIAGNOSIS — R351 Nocturia: Secondary | ICD-10-CM | POA: Diagnosis not present

## 2020-09-13 DIAGNOSIS — R972 Elevated prostate specific antigen [PSA]: Secondary | ICD-10-CM | POA: Diagnosis not present

## 2020-09-13 DIAGNOSIS — N401 Enlarged prostate with lower urinary tract symptoms: Secondary | ICD-10-CM | POA: Diagnosis not present

## 2020-11-29 DIAGNOSIS — R972 Elevated prostate specific antigen [PSA]: Secondary | ICD-10-CM | POA: Diagnosis not present

## 2020-12-06 DIAGNOSIS — R972 Elevated prostate specific antigen [PSA]: Secondary | ICD-10-CM | POA: Diagnosis not present

## 2020-12-06 DIAGNOSIS — N401 Enlarged prostate with lower urinary tract symptoms: Secondary | ICD-10-CM | POA: Diagnosis not present

## 2020-12-06 DIAGNOSIS — R351 Nocturia: Secondary | ICD-10-CM | POA: Diagnosis not present

## 2021-05-04 DIAGNOSIS — L821 Other seborrheic keratosis: Secondary | ICD-10-CM | POA: Diagnosis not present

## 2021-05-04 DIAGNOSIS — L570 Actinic keratosis: Secondary | ICD-10-CM | POA: Diagnosis not present

## 2021-05-04 DIAGNOSIS — D225 Melanocytic nevi of trunk: Secondary | ICD-10-CM | POA: Diagnosis not present

## 2021-05-04 DIAGNOSIS — Z85828 Personal history of other malignant neoplasm of skin: Secondary | ICD-10-CM | POA: Diagnosis not present

## 2021-05-04 DIAGNOSIS — L304 Erythema intertrigo: Secondary | ICD-10-CM | POA: Diagnosis not present

## 2021-06-06 DIAGNOSIS — R972 Elevated prostate specific antigen [PSA]: Secondary | ICD-10-CM | POA: Diagnosis not present

## 2021-06-13 DIAGNOSIS — R972 Elevated prostate specific antigen [PSA]: Secondary | ICD-10-CM | POA: Diagnosis not present

## 2021-06-13 DIAGNOSIS — N401 Enlarged prostate with lower urinary tract symptoms: Secondary | ICD-10-CM | POA: Diagnosis not present

## 2021-06-13 DIAGNOSIS — R351 Nocturia: Secondary | ICD-10-CM | POA: Diagnosis not present

## 2021-08-09 ENCOUNTER — Encounter: Payer: PPO | Admitting: Internal Medicine

## 2021-10-31 ENCOUNTER — Telehealth: Payer: Self-pay | Admitting: Internal Medicine

## 2021-10-31 DIAGNOSIS — R972 Elevated prostate specific antigen [PSA]: Secondary | ICD-10-CM

## 2021-10-31 DIAGNOSIS — I10 Essential (primary) hypertension: Secondary | ICD-10-CM

## 2021-10-31 NOTE — Telephone Encounter (Signed)
Spoke to pt. Tried to schedule him a lab visit this week but I keep getting blocks. Will forward to Terri in the lab to authorize an appointment time. I do not have the ability to overrun the schedule. He is willing to do a 4 hr fast to have it later in the day.  ?

## 2021-10-31 NOTE — Telephone Encounter (Signed)
Called pt back to get more info. He has not been seen since 12/21. He said he wants routine bloodwork with PSA. I advised he needs an OV or MCW as he is past due. Also, Dr Silvio Pate prefers to do same day labs in case there is something else that needs to be ordered after visiting with the pt. He insisted on having his bloodwork done prior to a visit. I made him an OV on 11-08-21. He declined a MCW. [employer] should cover a CPE (he has Engineer, maintenance (IT)). ? ?Dr Silvio Pate, please let me know if he can get labs before his 11-08-21 appt. I will call him to set up. ?

## 2021-10-31 NOTE — Telephone Encounter (Signed)
Pt called asking for an order for blood work. Please advise. ?

## 2021-11-02 ENCOUNTER — Other Ambulatory Visit (INDEPENDENT_AMBULATORY_CARE_PROVIDER_SITE_OTHER): Payer: PPO

## 2021-11-02 ENCOUNTER — Other Ambulatory Visit: Payer: Self-pay

## 2021-11-02 DIAGNOSIS — I10 Essential (primary) hypertension: Secondary | ICD-10-CM

## 2021-11-02 DIAGNOSIS — R972 Elevated prostate specific antigen [PSA]: Secondary | ICD-10-CM | POA: Diagnosis not present

## 2021-11-02 LAB — COMPREHENSIVE METABOLIC PANEL
ALT: 13 U/L (ref 0–53)
AST: 18 U/L (ref 0–37)
Albumin: 4.2 g/dL (ref 3.5–5.2)
Alkaline Phosphatase: 86 U/L (ref 39–117)
BUN: 26 mg/dL — ABNORMAL HIGH (ref 6–23)
CO2: 29 mEq/L (ref 19–32)
Calcium: 9.8 mg/dL (ref 8.4–10.5)
Chloride: 104 mEq/L (ref 96–112)
Creatinine, Ser: 0.94 mg/dL (ref 0.40–1.50)
GFR: 81.6 mL/min (ref >60.00)
Glucose, Bld: 85 mg/dL (ref 70–99)
Potassium: 3.8 mEq/L (ref 3.5–5.1)
Sodium: 140 mEq/L (ref 135–145)
Total Bilirubin: 0.9 mg/dL (ref 0.2–1.2)
Total Protein: 6.5 g/dL (ref 6.0–8.3)

## 2021-11-02 LAB — LIPID PANEL
Cholesterol: 176 mg/dL (ref 0–200)
HDL: 62.2 mg/dL (ref >39.00)
LDL Cholesterol: 101 mg/dL — ABNORMAL HIGH (ref 0–99)
NonHDL: 114.28
Total CHOL/HDL Ratio: 3
Triglycerides: 68 mg/dL (ref 0.0–149.0)
VLDL: 13.6 mg/dL (ref 0.0–40.0)

## 2021-11-02 LAB — CBC
HCT: 44.3 % (ref 39.0–52.0)
Hemoglobin: 14.5 g/dL (ref 13.0–17.0)
MCHC: 32.8 g/dL (ref 30.0–36.0)
MCV: 83.3 fl (ref 78.0–100.0)
Platelets: 204 10*3/uL (ref 150.0–400.0)
RBC: 5.32 Mil/uL (ref 4.22–5.81)
RDW: 14 % (ref 11.5–15.5)
WBC: 6.3 10*3/uL (ref 4.0–10.5)

## 2021-11-02 LAB — T4, FREE: Free T4: 0.93 ng/dL (ref 0.60–1.60)

## 2021-11-02 LAB — PSA: PSA: 8.09 ng/mL — ABNORMAL HIGH (ref 0.10–4.00)

## 2021-11-08 ENCOUNTER — Ambulatory Visit (INDEPENDENT_AMBULATORY_CARE_PROVIDER_SITE_OTHER): Payer: PPO | Admitting: Internal Medicine

## 2021-11-08 ENCOUNTER — Encounter: Payer: Self-pay | Admitting: Internal Medicine

## 2021-11-08 VITALS — BP 140/88 | HR 77 | Temp 97.7°F | Ht 70.0 in | Wt 189.0 lb

## 2021-11-08 DIAGNOSIS — N138 Other obstructive and reflux uropathy: Secondary | ICD-10-CM | POA: Diagnosis not present

## 2021-11-08 DIAGNOSIS — I10 Essential (primary) hypertension: Secondary | ICD-10-CM

## 2021-11-08 DIAGNOSIS — N401 Enlarged prostate with lower urinary tract symptoms: Secondary | ICD-10-CM | POA: Diagnosis not present

## 2021-11-08 DIAGNOSIS — Z0000 Encounter for general adult medical examination without abnormal findings: Secondary | ICD-10-CM | POA: Diagnosis not present

## 2021-11-08 DIAGNOSIS — R972 Elevated prostate specific antigen [PSA]: Secondary | ICD-10-CM | POA: Diagnosis not present

## 2021-11-08 NOTE — Assessment & Plan Note (Signed)
Healthy ?Will need one last colonoscopy in the next few years ?Stays active ?Flu vaccine in the fall---and COVID if recommended ?Consider shingrix at pharmacy ?

## 2021-11-08 NOTE — Assessment & Plan Note (Signed)
Was 4 six years ago ?Now up to 8 ?Discussed biopsy vs waiting----he is not excited about biopsy, so will recheck in 3 months ?

## 2021-11-08 NOTE — Assessment & Plan Note (Signed)
BP Readings from Last 3 Encounters:  ?11/08/21 140/88  ?08/04/20 130/90  ?10/31/18 134/90  ? ?Acceptable control without meds ?Will restart lisinopril '10mg'$  if persistent elevation ?

## 2021-11-08 NOTE — Progress Notes (Signed)
? ?Subjective:  ? ? Patient ID: Kurt Mccarthy, male    DOB: 1951-06-16, 71 y.o.   MRN: [id number] ? ?HPI ?Here for a physical ?Doing well overall ? ?He has some fluctuations in his BP ?Will occasionally be 140's/90 ?Other times 120's/80's ?Stays active ? ?Did see urologist last year ?PSA didn't change much---up to 7 ?Now last one was 8 ? ?No problems with ED ?Voids okay in general ?Seems to empty okay ?Nocturia stable x 2-3 ? ?Current Outpatient Medications on File Prior to Visit  ?Medication Sig Dispense Refill  ? glucosamine-chondroitin 500-400 MG tablet Take 1 tablet by mouth 3 (three) times daily.    ? ?No current facility-administered medications on file prior to visit.  ? ? ?Allergies  ?Allergen Reactions  ? Penicillins   ? ? ?Past Medical History:  ?Diagnosis Date  ? History of colonic polyps   ? Hypertension   ? ? ?Past Surgical History:  ?Procedure Laterality Date  ? CARPAL TUNNEL RELEASE  03/07 & 04/07  ? right and left  ? ? ?Family History  ?Problem Relation Age of Onset  ? Heart disease Maternal Grandfather   ? Diabetes Paternal Grandfather   ? Heart disease Mother   ?     MI  ? Cancer Father   ?     lung cancer from smoking  ? Congestive Heart Failure Sister   ?     1 sister  ? Parkinson's disease Brother   ? Multiple sclerosis Brother   ? ? ?Social History  ? ?Socioeconomic History  ? Marital status: Married  ?  Spouse name: Not on file  ? Number of children: 1  ? Years of education: Not on file  ? Highest education level: Not on file  ?Occupational History  ? Occupation: Real Estate  ? Occupation: Builds houses and appraises  ?Tobacco Use  ? Smoking status: Never  ?  Passive exposure: Past  ? Smokeless tobacco: Never  ?Substance and Sexual Activity  ? Alcohol use: Yes  ? Drug use: No  ? Sexual activity: Not on file  ?Other Topics Concern  ? Not on file  ?Social History Narrative  ? No living will ---or isn't sure  ? Wife would be health care POA--then son  ? Would accept resuscitation  ? Wouldn't  want prolonged tube feeds if cognitively unaware  ? ?Social Determinants of Health  ? ?Financial Resource Strain: Not on file  ?Food Insecurity: Not on file  ?Transportation Needs: Not on file  ?Physical Activity: Not on file  ?Stress: Not on file  ?Social Connections: Not on file  ?Intimate Partner Violence: Not on file  ? ?Review of Systems  ?Constitutional:  Negative for fatigue and unexpected weight change.  ?     Wears seat belt  ?HENT:  Negative for dental problem, hearing loss and tinnitus.   ?     Keeps up with dentist  ?Eyes:  Negative for visual disturbance.  ?     No diplopia or unilateral vision loos  ?Respiratory:  Negative for cough, chest tightness and shortness of breath.   ?Cardiovascular:  Negative for chest pain, palpitations and leg swelling.  ?Gastrointestinal:  Negative for blood in stool and constipation.  ?     No heartburn  ?Endocrine: Negative for polydipsia and polyuria.  ?Genitourinary:  Negative for difficulty urinating.  ?Musculoskeletal:  Negative for back pain and joint swelling.  ?     Some left knee pain---did see ortho. It has improved ?  Rare ibuprofen or naproxen  ?Skin:  Negative for rash.  ?     Keeps up with dermatologist  ?Allergic/Immunologic: Positive for environmental allergies. Negative for immunocompromised state.  ?     Mild---no meds  ?Neurological:  Negative for dizziness, syncope, light-headedness and headaches.  ?Hematological:  Negative for adenopathy. Bruises/bleeds easily.  ?Psychiatric/Behavioral:  Negative for dysphoric mood and sleep disturbance. The patient is not nervous/anxious.   ? ?   ?Objective:  ? Physical Exam ?Constitutional:   ?   Appearance: Normal appearance.  ?HENT:  ?   Mouth/Throat:  ?   Pharynx: No oropharyngeal exudate or posterior oropharyngeal erythema.  ?Eyes:  ?   Conjunctiva/sclera: Conjunctivae normal.  ?   Pupils: Pupils are equal, round, and reactive to light.  ?Cardiovascular:  ?   Rate and Rhythm: Normal rate and regular rhythm.  ?    Pulses: Normal pulses.  ?   Heart sounds: No murmur heard. ?  No gallop.  ?Pulmonary:  ?   Effort: Pulmonary effort is normal.  ?   Breath sounds: Normal breath sounds. No wheezing or rales.  ?Abdominal:  ?   Palpations: Abdomen is soft.  ?   Tenderness: There is no abdominal tenderness.  ?Musculoskeletal:  ?   Cervical back: Neck supple.  ?   Right lower leg: No edema.  ?   Left lower leg: No edema.  ?Lymphadenopathy:  ?   Cervical: No cervical adenopathy.  ?Skin: ?   Findings: No lesion or rash.  ?Neurological:  ?   General: No focal deficit present.  ?   Mental Status: He is alert and oriented to person, place, and time.  ?Psychiatric:     ?   Mood and Affect: Mood normal.     ?   Behavior: Behavior normal.  ?  ? ? ? ? ?   ?Assessment & Plan:  ? ?

## 2021-11-08 NOTE — Assessment & Plan Note (Signed)
Mild symptoms Would consider tamsulosin if worsens 

## 2022-03-22 DIAGNOSIS — C44212 Basal cell carcinoma of skin of right ear and external auricular canal: Secondary | ICD-10-CM | POA: Diagnosis not present

## 2022-03-22 DIAGNOSIS — D485 Neoplasm of uncertain behavior of skin: Secondary | ICD-10-CM | POA: Diagnosis not present

## 2022-03-22 DIAGNOSIS — C4441 Basal cell carcinoma of skin of scalp and neck: Secondary | ICD-10-CM | POA: Diagnosis not present

## 2022-03-22 DIAGNOSIS — L570 Actinic keratosis: Secondary | ICD-10-CM | POA: Diagnosis not present

## 2022-03-22 DIAGNOSIS — Z85828 Personal history of other malignant neoplasm of skin: Secondary | ICD-10-CM | POA: Diagnosis not present

## 2022-03-24 DIAGNOSIS — D3131 Benign neoplasm of right choroid: Secondary | ICD-10-CM | POA: Diagnosis not present

## 2022-03-28 ENCOUNTER — Other Ambulatory Visit (INDEPENDENT_AMBULATORY_CARE_PROVIDER_SITE_OTHER): Payer: PPO

## 2022-03-28 DIAGNOSIS — R972 Elevated prostate specific antigen [PSA]: Secondary | ICD-10-CM | POA: Diagnosis not present

## 2022-03-28 LAB — PSA: PSA: 8.81 ng/mL — ABNORMAL HIGH (ref 0.10–4.00)

## 2022-04-26 DIAGNOSIS — C4441 Basal cell carcinoma of skin of scalp and neck: Secondary | ICD-10-CM | POA: Diagnosis not present

## 2022-04-26 DIAGNOSIS — C44212 Basal cell carcinoma of skin of right ear and external auricular canal: Secondary | ICD-10-CM | POA: Diagnosis not present

## 2022-04-26 DIAGNOSIS — Z85828 Personal history of other malignant neoplasm of skin: Secondary | ICD-10-CM | POA: Diagnosis not present

## 2022-05-03 DIAGNOSIS — L570 Actinic keratosis: Secondary | ICD-10-CM | POA: Diagnosis not present

## 2022-05-11 ENCOUNTER — Ambulatory Visit (INDEPENDENT_AMBULATORY_CARE_PROVIDER_SITE_OTHER): Payer: PPO

## 2022-05-11 DIAGNOSIS — Z23 Encounter for immunization: Secondary | ICD-10-CM

## 2022-11-14 ENCOUNTER — Encounter: Payer: PPO | Admitting: Internal Medicine

## 2023-03-28 DIAGNOSIS — D225 Melanocytic nevi of trunk: Secondary | ICD-10-CM | POA: Diagnosis not present

## 2023-03-28 DIAGNOSIS — Z85828 Personal history of other malignant neoplasm of skin: Secondary | ICD-10-CM | POA: Diagnosis not present

## 2023-03-28 DIAGNOSIS — D1801 Hemangioma of skin and subcutaneous tissue: Secondary | ICD-10-CM | POA: Diagnosis not present

## 2023-03-28 DIAGNOSIS — L821 Other seborrheic keratosis: Secondary | ICD-10-CM | POA: Diagnosis not present

## 2023-03-28 DIAGNOSIS — L905 Scar conditions and fibrosis of skin: Secondary | ICD-10-CM | POA: Diagnosis not present

## 2023-03-28 DIAGNOSIS — L570 Actinic keratosis: Secondary | ICD-10-CM | POA: Diagnosis not present

## 2023-03-29 DIAGNOSIS — D3131 Benign neoplasm of right choroid: Secondary | ICD-10-CM | POA: Diagnosis not present

## 2023-03-29 DIAGNOSIS — H2513 Age-related nuclear cataract, bilateral: Secondary | ICD-10-CM | POA: Diagnosis not present

## 2023-03-29 DIAGNOSIS — H40003 Preglaucoma, unspecified, bilateral: Secondary | ICD-10-CM | POA: Diagnosis not present

## 2023-05-15 ENCOUNTER — Ambulatory Visit (INDEPENDENT_AMBULATORY_CARE_PROVIDER_SITE_OTHER): Payer: PPO

## 2023-05-15 DIAGNOSIS — Z23 Encounter for immunization: Secondary | ICD-10-CM | POA: Diagnosis not present

## 2023-06-01 ENCOUNTER — Encounter: Payer: Self-pay | Admitting: Internal Medicine

## 2023-06-04 NOTE — Telephone Encounter (Signed)
error 

## 2023-06-06 ENCOUNTER — Ambulatory Visit (INDEPENDENT_AMBULATORY_CARE_PROVIDER_SITE_OTHER): Payer: PPO | Admitting: Internal Medicine

## 2023-06-06 ENCOUNTER — Encounter: Payer: Self-pay | Admitting: Internal Medicine

## 2023-06-06 VITALS — BP 128/88 | HR 70 | Temp 98.2°F | Ht 67.0 in | Wt 193.0 lb

## 2023-06-06 DIAGNOSIS — Z0000 Encounter for general adult medical examination without abnormal findings: Secondary | ICD-10-CM | POA: Diagnosis not present

## 2023-06-06 DIAGNOSIS — I10 Essential (primary) hypertension: Secondary | ICD-10-CM

## 2023-06-06 DIAGNOSIS — M170 Bilateral primary osteoarthritis of knee: Secondary | ICD-10-CM

## 2023-06-06 DIAGNOSIS — R972 Elevated prostate specific antigen [PSA]: Secondary | ICD-10-CM | POA: Diagnosis not present

## 2023-06-06 DIAGNOSIS — N529 Male erectile dysfunction, unspecified: Secondary | ICD-10-CM | POA: Diagnosis not present

## 2023-06-06 LAB — COMPREHENSIVE METABOLIC PANEL
ALT: 12 U/L (ref 0–53)
AST: 17 U/L (ref 0–37)
Albumin: 4.2 g/dL (ref 3.5–5.2)
Alkaline Phosphatase: 92 U/L (ref 39–117)
BUN: 17 mg/dL (ref 6–23)
CO2: 31 meq/L (ref 19–32)
Calcium: 9.8 mg/dL (ref 8.4–10.5)
Chloride: 102 meq/L (ref 96–112)
Creatinine, Ser: 0.94 mg/dL (ref 0.40–1.50)
GFR: 80.69 mL/min (ref >60.00)
Glucose, Bld: 86 mg/dL (ref 70–99)
Potassium: 4 meq/L (ref 3.5–5.1)
Sodium: 141 meq/L (ref 135–145)
Total Bilirubin: 0.9 mg/dL (ref 0.2–1.2)
Total Protein: 6.7 g/dL (ref 6.0–8.3)

## 2023-06-06 LAB — CBC
HCT: 46.5 % (ref 39.0–52.0)
Hemoglobin: 14.8 g/dL (ref 13.0–17.0)
MCHC: 31.7 g/dL (ref 30.0–36.0)
MCV: 85 fL (ref 78.0–100.0)
Platelets: 219 10*3/uL (ref 150.0–400.0)
RBC: 5.48 Mil/uL (ref 4.22–5.81)
RDW: 14 % (ref 11.5–15.5)
WBC: 6.6 10*3/uL (ref 4.0–10.5)

## 2023-06-06 LAB — PSA: PSA: 13.91 ng/mL — ABNORMAL HIGH (ref 0.10–4.00)

## 2023-06-06 MED ORDER — TADALAFIL 20 MG PO TABS: 10.0000 mg | ORAL_TABLET | ORAL | 11 refills | Status: DC | PRN

## 2023-06-06 NOTE — Assessment & Plan Note (Signed)
Mild Will try tadalafil 10-20

## 2023-06-06 NOTE — Assessment & Plan Note (Signed)
Does well with the glucosamine chondroitin

## 2023-06-06 NOTE — Progress Notes (Signed)
Subjective:    Patient ID: Kurt Mccarthy, male    DOB: 1950/09/12, 72 y.o.   MRN: [id number]  HPI Here for Medicare wellness visit and follow up of chronic medical conditions Reviewed advanced directives Reviewed other doctors--Dr Porfilio--ophthal, Dr Tim Lair, [facility] No hospitalizations or surgery in past year Walks at least 5 miles a day (total steps)--resistance with yard work (tree work, Catering manager) Still works Rare beer No tobacco Vision is okay Hearing is fairly good--mild loss only No falls No depression or anhedonia Independent with instrumental ADLs No sig memory issues  Didn't go to urologist PSA has been slowly rising  Does void okay Ongoing nocturia--some trouble getting back to sleep when up at 3AM Has cut back night fluids  No chest pain or SOB No dizziness or syncope No palpitations No edema No headaches  Current Outpatient Medications on File Prior to Visit  Medication Sig Dispense Refill   Cholecalciferol (VITAMIN D) 125 MCG (5000 UT) CAPS Take by mouth.     Cyanocobalamin (B-12) 5000 MCG CAPS Take by mouth.     Misc Natural Products (GLUCOSAMINE-CHONDROITIN PLUS PO) Take by mouth.     Omega-3 Fatty Acids (FISH OIL PO) Take by mouth as needed.     vitamin E 180 MG (400 UNITS) capsule Take 400 Units by mouth as needed.     glucosamine-chondroitin 500-400 MG tablet Take 1 tablet by mouth 3 (three) times daily.     No current facility-administered medications on file prior to visit.    Allergies  Allergen Reactions   Penicillins     Past Medical History:  Diagnosis Date   History of colonic polyps    Hypertension     Past Surgical History:  Procedure Laterality Date   CARPAL TUNNEL RELEASE  03/07 & 04/07   right and left    Family History  Problem Relation Age of Onset   Heart disease Mother        MI   Cancer Father        lung cancer from smoking   Congestive Heart Failure Sister        1 sister   Parkinson's disease  Brother    Multiple sclerosis Brother    Multiple sclerosis Brother    Parkinson's disease Brother    Heart disease Maternal Grandfather    Diabetes Paternal Grandfather     Social History   Socioeconomic History   Marital status: Married    Spouse name: Not on file   Number of children: 1   Years of education: Not on file   Highest education level: Not on file  Occupational History   Occupation: Research officer, political party   Occupation: Builds houses and appraises  Tobacco Use   Smoking status: Never    Passive exposure: Past   Smokeless tobacco: Never  Substance and Sexual Activity   Alcohol use: Yes   Drug use: No   Sexual activity: Not on file  Other Topics Concern   Not on file  Social History Narrative   No living will ---or isn't sure   Wife would be health care POA--then son   Would accept resuscitation   Wouldn't want prolonged tube feeds if cognitively unaware   Social Determinants of Health   Financial Resource Strain: Not on file  Food Insecurity: Not on file  Transportation Needs: Not on file  Physical Activity: Not on file  Stress: Not on file  Social Connections: Not on file  Intimate Partner Violence: Not on  file   Review of Systems Appetite is fine--works on healthy eating Weight is stable Generally sleeps okay Wears seat belt Teeth are okay--keeps up with dentist No suspicious skin lesions now No heartburn or dysphagia Bowels move fine--no blood No sig back or joint pains---mild knee arthritis (better with glucosamine/chondroitin) Having some ED    Objective:   Physical Exam Constitutional:      Appearance: Normal appearance.  HENT:     Mouth/Throat:     Pharynx: No oropharyngeal exudate or posterior oropharyngeal erythema.  Eyes:     Conjunctiva/sclera: Conjunctivae normal.     Pupils: Pupils are equal, round, and reactive to light.  Cardiovascular:     Rate and Rhythm: Normal rate and regular rhythm.     Pulses: Normal pulses.     Heart  sounds: No murmur heard.    No gallop.  Pulmonary:     Effort: Pulmonary effort is normal.     Breath sounds: Normal breath sounds. No wheezing or rales.  Abdominal:     Palpations: Abdomen is soft.     Tenderness: There is no abdominal tenderness.  Musculoskeletal:     Cervical back: Neck supple.     Right lower leg: No edema.     Left lower leg: No edema.  Lymphadenopathy:     Cervical: No cervical adenopathy.  Skin:    Findings: No lesion or rash.  Neurological:     General: No focal deficit present.     Mental Status: He is alert and oriented to person, place, and time.     Comments: Word naming 7/1 minute Recall 3/3  Psychiatric:        Mood and Affect: Mood normal.        Behavior: Behavior normal.            Assessment & Plan:

## 2023-06-06 NOTE — Progress Notes (Signed)
Hearing Screening - Comments:: Passed whisper test Vision Screening - Comments:: September 2024

## 2023-06-06 NOTE — Assessment & Plan Note (Signed)
Slowly going up If higher this year--will send to urology in [facility]

## 2023-06-06 NOTE — Assessment & Plan Note (Signed)
BP Readings from Last 3 Encounters:  06/06/23 128/88  11/08/21 140/88  08/04/20 130/90   Good control now with lifestyle

## 2023-06-06 NOTE — Assessment & Plan Note (Signed)
I have personally reviewed the Medicare Annual Wellness questionnaire and have noted 1. The patient's medical and social history 2. Their use of alcohol, tobacco or illicit drugs 3. Their current medications and supplements 4. The patient's functional ability including ADL's, fall risks, home safety risks and hearing or visual             impairment. 5. Diet and physical activities 6. Evidence for depression or mood disorders  The patients weight, height, BMI and visual acuity have been recorded in the chart I have made referrals, counseling and provided education to the patient based review of the above and I have provided the pt with a written personalized care plan for preventive services.  I have provided you with a copy of your personalized plan for preventive services. Please take the time to review along with your updated medication list.  Exercises regularly Flu vaccine today Will consider COVID and shingrix vaccines RSV when 74 One last colon in 2 years

## 2023-06-07 ENCOUNTER — Other Ambulatory Visit: Payer: Self-pay | Admitting: Internal Medicine

## 2023-06-07 DIAGNOSIS — R972 Elevated prostate specific antigen [PSA]: Secondary | ICD-10-CM

## 2023-07-26 ENCOUNTER — Ambulatory Visit: Payer: Self-pay | Admitting: Urology

## 2023-11-19 DIAGNOSIS — R972 Elevated prostate specific antigen [PSA]: Secondary | ICD-10-CM | POA: Diagnosis not present

## 2023-11-26 DIAGNOSIS — R972 Elevated prostate specific antigen [PSA]: Secondary | ICD-10-CM | POA: Diagnosis not present

## 2023-11-26 DIAGNOSIS — N401 Enlarged prostate with lower urinary tract symptoms: Secondary | ICD-10-CM | POA: Diagnosis not present

## 2023-11-26 DIAGNOSIS — R351 Nocturia: Secondary | ICD-10-CM | POA: Diagnosis not present

## 2024-01-22 DIAGNOSIS — D485 Neoplasm of uncertain behavior of skin: Secondary | ICD-10-CM | POA: Diagnosis not present

## 2024-01-22 DIAGNOSIS — B078 Other viral warts: Secondary | ICD-10-CM | POA: Diagnosis not present

## 2024-01-22 DIAGNOSIS — Z85828 Personal history of other malignant neoplasm of skin: Secondary | ICD-10-CM | POA: Diagnosis not present

## 2024-01-22 DIAGNOSIS — D225 Melanocytic nevi of trunk: Secondary | ICD-10-CM | POA: Diagnosis not present

## 2024-01-22 DIAGNOSIS — D692 Other nonthrombocytopenic purpura: Secondary | ICD-10-CM | POA: Diagnosis not present

## 2024-01-22 DIAGNOSIS — L570 Actinic keratosis: Secondary | ICD-10-CM | POA: Diagnosis not present

## 2024-01-22 DIAGNOSIS — L821 Other seborrheic keratosis: Secondary | ICD-10-CM | POA: Diagnosis not present

## 2024-01-22 DIAGNOSIS — L817 Pigmented purpuric dermatosis: Secondary | ICD-10-CM | POA: Diagnosis not present

## 2024-01-22 DIAGNOSIS — L98499 Non-pressure chronic ulcer of skin of other sites with unspecified severity: Secondary | ICD-10-CM | POA: Diagnosis not present

## 2024-02-28 ENCOUNTER — Encounter: Payer: Self-pay | Admitting: Internal Medicine

## 2024-02-28 DIAGNOSIS — R972 Elevated prostate specific antigen [PSA]: Secondary | ICD-10-CM

## 2024-03-31 DIAGNOSIS — D3131 Benign neoplasm of right choroid: Secondary | ICD-10-CM | POA: Diagnosis not present

## 2024-03-31 DIAGNOSIS — H40003 Preglaucoma, unspecified, bilateral: Secondary | ICD-10-CM | POA: Diagnosis not present

## 2024-03-31 DIAGNOSIS — H2513 Age-related nuclear cataract, bilateral: Secondary | ICD-10-CM | POA: Diagnosis not present

## 2024-04-09 ENCOUNTER — Telehealth: Payer: Self-pay | Admitting: Internal Medicine

## 2024-04-09 NOTE — Telephone Encounter (Signed)
 Copied from CRM [id number]. Topic: Appointments - Appointment Scheduling >> Apr 09, 2024  1:21 PM [name] wrote: Patient/patient representative is calling to schedule an appointment. Refer to attachments for appointment information.  Patient calling to schedule flu injection  I am unable to schedule appt due to warning   the Template release dated of 07/06/24 date of [facility]  Patient would like a call regarding scheduling flu injection

## 2024-04-30 ENCOUNTER — Ambulatory Visit (INDEPENDENT_AMBULATORY_CARE_PROVIDER_SITE_OTHER)

## 2024-04-30 DIAGNOSIS — Z23 Encounter for immunization: Secondary | ICD-10-CM | POA: Diagnosis not present

## 2024-04-30 NOTE — Progress Notes (Signed)
 Per orders of Dr. Reuben Burkes , injection of high dose flu shot given by Laray Arenas in right deltoid. Patient tolerated injection well. Patient has toc appointment with Dr Burkes already scheduled

## 2024-05-23 ENCOUNTER — Ambulatory Visit (INDEPENDENT_AMBULATORY_CARE_PROVIDER_SITE_OTHER)

## 2024-05-23 VITALS — BP 130/78 | Ht 67.0 in | Wt 192.4 lb

## 2024-05-23 DIAGNOSIS — Z0000 Encounter for general adult medical examination without abnormal findings: Secondary | ICD-10-CM

## 2024-05-23 NOTE — Progress Notes (Signed)
 Subjective:   Kurt Mccarthy is a 73 y.o. who presents for a Medicare Wellness preventive visit.  As a reminder, Annual Wellness Visits don't include a physical exam, and some assessments may be limited, especially if this visit is performed virtually. We may recommend an in-person follow-up visit with your provider if needed.  Visit Complete: In person  Persons Participating in Visit: Patient.  AWV Questionnaire: No: Patient Medicare AWV questionnaire was not completed prior to this visit.  Cardiac Risk Factors include: advanced age (>50men, >43 women);male gender;obesity (BMI >30kg/m2);hypertension     Objective:    Today's Vitals   05/23/24 0846 05/23/24 0849  Weight: 192 lb 6.4 oz (87.3 kg)   Height: 5' 7 (1.702 m)   PainSc:  3    Body mass index is 30.13 kg/m.     05/23/2024    9:07 AM  Advanced Directives  Does Patient Have a Medical Advance Directive? Yes  Type of Estate agent of [place];Living will  Copy of Healthcare Power of Attorney in Chart? No - copy requested    Current Medications (verified) Outpatient Encounter Medications as of 05/23/2024  Medication Sig   Cholecalciferol (VITAMIN D) 125 MCG (5000 UT) CAPS Take by mouth.   Cyanocobalamin (B-12) 5000 MCG CAPS Take by mouth.   glucosamine-chondroitin 500-400 MG tablet Take 1 tablet by mouth 3 (three) times daily.   Misc Natural Products (GLUCOSAMINE-CHONDROITIN PLUS PO) Take by mouth.   Omega-3 Fatty Acids (FISH OIL PO) Take by mouth as needed.   tadalafil  (CIALIS ) 20 MG tablet Take 0.5-1 tablets (10-20 mg total) by mouth every other day as needed for erectile dysfunction.   vitamin E 180 MG (400 UNITS) capsule Take 400 Units by mouth as needed.   No facility-administered encounter medications on file as of 05/23/2024.    Allergies (verified) Penicillins   History: Past Medical History:  Diagnosis Date   History of colonic polyps    Hypertension    Past Surgical  History:  Procedure Laterality Date   CARPAL TUNNEL RELEASE  03/07 & 04/07   right and left   Family History  Problem Relation Age of Onset   Heart disease Mother        MI   Cancer Father        lung cancer from smoking   Congestive Heart Failure Sister        1 sister   Parkinson's disease Brother    Multiple sclerosis Brother    Multiple sclerosis Brother    Parkinson's disease Brother    Heart disease Maternal Grandfather    Diabetes Paternal Grandfather    Social History   Socioeconomic History   Marital status: Married    Spouse name: Not on file   Number of children: 1   Years of education: Not on file   Highest education level: Not on file  Occupational History   Occupation: Research officer, political party   Occupation: Builds houses and appraises  Tobacco Use   Smoking status: Never    Passive exposure: Past   Smokeless tobacco: Never  Substance and Sexual Activity   Alcohol use: Yes   Drug use: No   Sexual activity: Not on file  Other Topics Concern   Not on file  Social History Narrative   No living will ---or isn't sure   Wife would be health care POA--then son   Would accept resuscitation   Wouldn't want prolonged tube feeds if cognitively unaware   Social Drivers  of Health   Financial Resource Strain: Low Risk  (05/23/2024)   Overall Financial Resource Strain (CARDIA)    Difficulty of Paying Living Expenses: Not hard at all  Food Insecurity: Unknown (05/23/2024)   Hunger Vital Sign    Worried About Running Out of Food in the Last Year: Never true    Ran Out of Food in the Last Year: Not on file  Transportation Needs: No Transportation Needs (05/23/2024)   PRAPARE - Administrator, Civil Service (Medical): No    Lack of Transportation (Non-Medical): No  Physical Activity: Sufficiently Active (05/23/2024)   Exercise Vital Sign    Days of Exercise per Week: 6 days    Minutes of Exercise per Session: 120 min  Stress: No Stress Concern Present  (05/23/2024)   [employer] of Occupational Health - Occupational Stress Questionnaire    Feeling of Stress: Not at all  Social Connections: Moderately Isolated (05/23/2024)   Social Connection and Isolation Panel    Frequency of Communication with Friends and Family: Twice a week    Frequency of Social Gatherings with Friends and Family: Never    Attends Religious Services: More than 4 times per year    Active Member of [employer] or Organizations: No    Attends Engineer, structural: Never    Marital Status: Married    Tobacco Counseling Counseling given: Not Answered    Clinical Intake:  Pre-visit preparation completed: Yes  Pain : 0-10 Pain Score: 3  Pain Location: Knee Pain Orientation: Left Pain Descriptors / Indicators: Aching Pain Onset: More than a month ago Pain Frequency: Intermittent Pain Relieving Factors: nothing Effect of Pain on Daily Activities: none  Pain Relieving Factors: nothing  BMI - recorded: 30.13 Nutritional Status: BMI > 30  Obese Nutritional Risks: None Diabetes: No  No results found for: HGBA1C   How often do you need to have someone help you when you read instructions, pamphlets, or other written materials from your doctor or pharmacy?: 1 - Never  Interpreter Needed?: No  Comments: lives with wife Information entered by :: B.Jalecia Leon,LPN   Activities of Daily Living     05/23/2024    9:09 AM  In your present state of health, do you have any difficulty performing the following activities:  Hearing? 0  Vision? 0  Difficulty concentrating or making decisions? 0  Walking or climbing stairs? 0  Dressing or bathing? 0  Doing errands, shopping? 0  Preparing Food and eating ? N  Using the Toilet? N  In the past six months, have you accidently leaked urine? N  Do you have problems with loss of bowel control? N  Managing your Medications? N  Managing your Finances? N  Housekeeping or managing your Housekeeping? N     Patient Care Team: Jimmy Charlie FERNS, MD as PCP - General Jaye Fallow, MD as Referring Physician (Ophthalmology)  I have updated your Care Teams any recent Medical Services you may have received from other providers in the past year.     Assessment:   This is a routine wellness examination for [facility].  Hearing/Vision screen Hearing Screening - Comments:: Patient denies any hearing difficulties.   Vision Screening - Comments:: Pt says their vision is good with glasses (does not wear) uses readers Dr  Jaye   Goals Addressed             This Visit's Progress    Patient Stated       Maintain my weight  and exercise and eat better       Depression Screen     05/23/2024    9:00 AM 06/06/2023    9:58 AM 06/06/2023    9:15 AM 08/04/2020    9:02 AM 10/22/2018   10:51 AM 03/21/2016   10:15 AM  PHQ 2/9 Scores  PHQ - 2 Score 0 0 0 0 0 0    Fall Risk     05/23/2024    8:53 AM 06/06/2023    9:58 AM 06/06/2023    9:15 AM 08/04/2020    9:02 AM 03/02/2020    3:39 PM  Fall Risk   Falls in the past year? 0 0 0 0 0  Comment     Emmi Telephone Survey: data to providers prior to load  Number falls in past yr: 0  0    Injury with Fall? 0  0    Risk for fall due to : No Fall Risks  No Fall Risks    Follow up Education provided;Falls prevention discussed  Falls evaluation completed      MEDICARE RISK AT HOME:  Medicare Risk at Home Any stairs in or around the home?: Yes If so, are there any without handrails?: Yes Home free of loose throw rugs in walkways, pet beds, electrical cords, etc?: Yes Adequate lighting in your home to reduce risk of falls?: Yes Life alert?: No Use of a cane, walker or w/c?: No Grab bars in the bathroom?: No Shower chair or bench in shower?: No Elevated toilet seat or a handicapped toilet?: Yes  TIMED UP AND GO:  Was the test performed?  Yes  Length of time to ambulate 10 feet: 10 sec Gait steady and fast without use of assistive  device  Cognitive Function: 6CIT completed        05/23/2024    9:11 AM  6CIT Screen  What Year? 0 points  What month? 0 points  What time? 0 points  Count back from 20 0 points  Months in reverse 0 points  Repeat phrase 0 points  Total Score 0 points    Immunizations Immunization History  Administered Date(s) Administered   Fluad Quad(high Dose 65+) 04/29/2020, 05/11/2022   Fluad Trivalent(High Dose 65+) 05/15/2023   INFLUENZA, HIGH DOSE SEASONAL PF 04/30/2024   Influenza,inj,Quad PF,6+ Mos 06/25/2015, 05/24/2016, 05/17/2017, 05/23/2018, 05/01/2019   Influenza-Unspecified 05/07/2014   PFIZER(Purple Top)SARS-COV-2 Vaccination 09/29/2019, 10/21/2019, 07/09/2020   Pneumococcal Conjugate-13 03/21/2016   Pneumococcal Polysaccharide-23 10/22/2018   Td 06/05/2005   Tdap 03/19/2015   Zoster, Live 02/28/2011    Screening Tests Health Maintenance  Topic Date Due   Zoster Vaccines- Shingrix (1 of 2) 05/24/2000   COVID-19 Vaccine (4 - 2025-26 season) 04/07/2024   DTaP/Tdap/Td (3 - Td or Tdap) 03/18/2025   Medicare Annual Wellness (AWV)  05/23/2025   Colonoscopy  07/13/2025   Pneumococcal Vaccine: 50+ Years  Completed   Influenza Vaccine  Completed   Hepatitis C Screening  Completed   Meningococcal B Vaccine  Aged Out    Health Maintenance Items Addressed: None at this time  Additional Screening:  Vision Screening: Recommended annual ophthalmology exams for early detection of glaucoma and other disorders of the eye. Is the patient up to date with their annual eye exam?  Yes  Who is the provider or what is the name of the office in which the patient attends annual eye exams? Dr Jaye  Dental Screening: Recommended annual dental exams for proper oral hygiene  [facility]  Referral / Chronic Care Management: CRR required this visit?  No   CCM required this visit?  No   Plan:    I have personally reviewed and noted the following in the patient's chart:    Medical and social history Use of alcohol, tobacco or illicit drugs  Current medications and supplements including opioid prescriptions. Patient is not currently taking opioid prescriptions. Functional ability and status Nutritional status Physical activity Advanced directives List of other physicians Hospitalizations, surgeries, and ER visits in previous 12 months Vitals Screenings to include cognitive, depression, and falls Referrals and appointments  In addition, I have reviewed and discussed with patient certain preventive protocols, quality metrics, and best practice recommendations. A written personalized care plan for preventive services as well as general preventive health recommendations were provided to patient.   Erminio LITTIE Saris, LPN   [id number]   After Visit Summary: (In Person-Printed) AVS printed and given to the patient  Notes: Pt is scheduled w/new PCP (transfer of care) in 2 weeks :he has concerns about elev ated PSA and increase in Cialis  other wise is doing well.

## 2024-05-23 NOTE — Patient Instructions (Signed)
 [name],  Thank you for taking the time for your Medicare Wellness Visit. I appreciate your continued commitment to your health goals. Please review the care plan we discussed, and feel free to reach out if I can assist you further.  Medicare recommends these wellness visits once per year to help you and your care team stay ahead of potential health issues. These visits are designed to focus on prevention, allowing your provider to concentrate on managing your acute and chronic conditions during your regular appointments.  Please note that Annual Wellness Visits do not include a physical exam. Some assessments may be limited, especially if the visit was conducted virtually. If needed, we may recommend a separate in-person follow-up with your provider.  Ongoing Care Seeing your primary care provider every 3 to 6 months helps us  monitor your health and provide consistent, personalized care.   Referrals If a referral was made during today's visit and you haven't received any updates within two weeks, please contact the referred provider directly to check on the status.  Recommended Screenings:  Health Maintenance  Topic Date Due   Zoster (Shingles) Vaccine (1 of 2) 05/24/2000   COVID-19 Vaccine (4 - 2025-26 season) 04/07/2024   Medicare Annual Wellness Visit  06/05/2024   DTaP/Tdap/Td vaccine (3 - Td or Tdap) 03/18/2025   Colon Cancer Screening  07/13/2025   Pneumococcal Vaccine for age over 8  Completed   Flu Shot  Completed   Hepatitis C Screening  Completed   Meningitis B Vaccine  Aged Out       05/23/2024    9:07 AM  Advanced Directives  Does Patient Have a Medical Advance Directive? Yes  Type of Estate agent of [place];Living will  Copy of Healthcare Power of Attorney in Chart? No - copy requested   Advance Care Planning is important because it: Ensures you receive medical care that aligns with your values, goals, and preferences. Provides guidance  to your family and loved ones, reducing the emotional burden of decision-making during critical moments.  Vision: Annual vision screenings are recommended for early detection of glaucoma, cataracts, and diabetic retinopathy. These exams can also reveal signs of chronic conditions such as diabetes and high blood pressure.  Dental: Annual dental screenings help detect early signs of oral cancer, gum disease, and other conditions linked to overall health, including heart disease and diabetes.

## 2024-06-06 ENCOUNTER — Ambulatory Visit

## 2024-06-06 VITALS — BP 124/84 | HR 67 | Temp 98.4°F | Ht 71.0 in | Wt 193.0 lb

## 2024-06-06 DIAGNOSIS — E538 Deficiency of other specified B group vitamins: Secondary | ICD-10-CM | POA: Diagnosis not present

## 2024-06-06 DIAGNOSIS — E559 Vitamin D deficiency, unspecified: Secondary | ICD-10-CM

## 2024-06-06 DIAGNOSIS — N529 Male erectile dysfunction, unspecified: Secondary | ICD-10-CM | POA: Diagnosis not present

## 2024-06-06 DIAGNOSIS — Z7185 Encounter for immunization safety counseling: Secondary | ICD-10-CM | POA: Diagnosis not present

## 2024-06-06 DIAGNOSIS — R972 Elevated prostate specific antigen [PSA]: Secondary | ICD-10-CM | POA: Diagnosis not present

## 2024-06-06 LAB — VITAMIN B12: Vitamin B-12: >1500 pg/mL — ABNORMAL HIGH (ref 211–911)

## 2024-06-06 LAB — VITAMIN D 25 HYDROXY (VIT D DEFICIENCY, FRACTURES): VITD: 54.22 ng/mL (ref 30.00–100.00)

## 2024-06-06 LAB — PSA, MEDICARE: PSA: 13.65 ng/mL — ABNORMAL HIGH (ref 0.10–4.00)

## 2024-06-06 MED ORDER — TADALAFIL 20 MG PO TABS
20.0000 mg | ORAL_TABLET | ORAL | 0 refills | Status: AC | PRN
Start: 2024-06-06 — End: 2024-08-05

## 2024-06-06 NOTE — Progress Notes (Signed)
 Subjective:   This visit was conducted in person. The patient gave informed consent to the use of Abridge AI technology to record the contents of the encounter as documented below.   Patient ID: Kurt Mccarthy, male    DOB: 1951-01-12, 73 y.o.   MRN: [id number]   Discussed the use of AI scribe software for clinical note transcription with the patient, who gave verbal consent to proceed.  History of Present Illness Kurt Mccarthy is a 73 year old male who presents for a follow-up visit to discuss his medical history and medications.  He has a history of elevated PSA levels, which have been progressively rising. He was referred to [facility] but was dissatisfied with his service due to issues such as lost lab results. A biopsy was recommended, but he was not confident in the practice. He was previously referred to [facility] but has had difficulty establishing contact for an appointment. He is considering changing his insurance to seek care at [facility]. He has not experienced symptoms like low urine flow but does have frequent urination, which he attributes to a lifelong pattern rather than prostate issues.  He was prescribed Cialis  as needed for age-related changes, but does not report a history of erectile dysfunction. He prefers to take one tablet rather than splitting them. He was previously prescribed tamsulosin (Flomax) by a urologist but chose not to take it as he did not feel it was necessary.  He has well-controlled hypertension. His current medications include glucosamine, vitamin B3, vitamin D3, vitamin B12, and occasional vitamin E. He takes 5000 units of vitamin B12 and vitamin D3, which is higher than typical doses, and is concerned about potential toxicity from these supplements.  He has a family history of prostate cancer, with a brother-in-law and friends who have died from it, contributing to his concern about his elevated PSA levels.  He has received a flu shot but has not yet  received the COVID booster or shingles vaccine. He is considering getting the shingles vaccine due to a previous mild case of shingles.      Review of Systems  All other systems reviewed and are negative.       Allergies  Allergen Reactions   Penicillins     Current Outpatient Medications on File Prior to Visit  Medication Sig Dispense Refill   Cholecalciferol (VITAMIN D) 125 MCG (5000 UT) CAPS Take by mouth.     Cyanocobalamin (B-12) 5000 MCG CAPS Take by mouth.     glucosamine-chondroitin 500-400 MG tablet Take 1 tablet by mouth 3 (three) times daily.     Misc Natural Products (GLUCOSAMINE-CHONDROITIN PLUS PO) Take by mouth.     Omega-3 Fatty Acids (FISH OIL PO) Take by mouth as needed.     vitamin E 180 MG (400 UNITS) capsule Take 400 Units by mouth as needed.     No current facility-administered medications on file prior to visit.    BP 124/84 (BP Location: Left Arm, Patient Position: Sitting, Cuff Size: Large)   Pulse 67   Temp 98.4 F (36.9 C) (Oral)   Ht 5' 11 (1.803 m)   Wt 193 lb (87.5 kg)   SpO2 97%   BMI 26.92 kg/m   Objective:      Physical Exam GENERAL: Alert, cooperative, well developed, no acute distress. HEAD: Normocephalic atraumatic. EXTREMITIES: No cyanosis or edema. NEUROLOGICAL: Oriented to person, place and time, no gait abnormalities, moves all extremities without gross motor or sensory deficit.  Assessment & Plan:   Assessment & Plan  Elevated PSA and Benign Prostatic Hyperplasia with lower urinary tract symptoms Elevated PSA with benign prostatic hyperplasia. Concerns about prostate cancer due to family history. Previous urologist visit unsatisfactory. Per chart review patient saw [facility] in April 2025, was prescribed Flomax for his symptoms and there was plan for biopsy.  Per patient, he did not want to pursue biopsy with him, instead would like to be seen by [facility], referral sent.  He has also self discontinued the  Flomax, feels he does not need it  - Refer to [facility] for further evaluation and potential biopsy. - Make referral urgent and provide contact information for follow-up. - Repeat PSA today  Erectile Dysfunction Erectile dysfunction managed with Cialis  as needed. Prefers one tablet rather than splitting tablets.  - Prescribe Cialis  30 tablets for a 60-day supply. - Send prescription to [facility] on [address].  Vitamin D and Vitamin B12 supplementation monitoring High doses of Vitamin D and B12 supplementation with potential risk of toxicity if levels are unchecked.  - Order lab tests to check Vitamin D and B12 levels. - Review lab results to adjust supplementation as necessary.  General Health Maintenance Discussion of vaccinations including COVID booster and shingles vaccine. COVID booster recommended due to age-related risk. Shingles vaccine recommended due to previous mild case and potential for severe complications. - Recommend COVID booster and shingles vaccine. - Advise to obtain vaccines from pharmacy.     Return in about 3 weeks (around 06/27/2024) for CPE.   [name], MD  06/06/24     Contains text generated by Abridge.

## 2024-06-06 NOTE — Patient Instructions (Signed)
 Thank you for visiting [facility] today! Here's what we talked about: - Get covid and shingles vaccines from your pharmacy - Call [facility] in 1 week

## 2024-06-07 ENCOUNTER — Ambulatory Visit: Payer: Self-pay

## 2024-06-07 MED ORDER — VITAMIN D3 25 MCG (1000 UT) PO CAPS: 1000.0000 [IU] | ORAL_CAPSULE | Freq: Every day | ORAL | 1 refills | Status: AC

## 2024-06-25 ENCOUNTER — Other Ambulatory Visit: Payer: Self-pay

## 2024-06-25 MED ORDER — SHINGRIX 50 MCG/0.5ML IM SUSR
INTRAMUSCULAR | 1 refills | Status: AC
  Filled 2024-06-25: qty 0.5, 1d supply, fill #0

## 2024-07-04 ENCOUNTER — Encounter: Payer: Self-pay | Admitting: *Deleted

## 2024-08-20 ENCOUNTER — Encounter

## 2025-05-28 ENCOUNTER — Ambulatory Visit

## 2025-05-29 ENCOUNTER — Ambulatory Visit
